# Patient Record
Sex: Female | Born: 1985 | Race: White | Hispanic: No | Marital: Married | Smoking: Former smoker
Health system: Southern US, Community
[De-identification: ages and names within clinical notes are randomized; demographics above are authoritative.]

## PROBLEM LIST (undated history)

## (undated) DIAGNOSIS — K219 Gastro-esophageal reflux disease without esophagitis: Secondary | ICD-10-CM

## (undated) DIAGNOSIS — F419 Anxiety disorder, unspecified: Secondary | ICD-10-CM

## (undated) HISTORY — PX: TONSILECTOMY/ADENOIDECTOMY WITH MYRINGOTOMY: SHX6125

## (undated) HISTORY — DX: Anxiety disorder, unspecified: F41.9

## (undated) HISTORY — PX: TONSILLECTOMY: SUR1361

---

## 2008-07-25 ENCOUNTER — Inpatient Hospital Stay (HOSPITAL_COMMUNITY): Admission: EM | Admit: 2008-07-25 | Discharge: 2008-07-26 | Payer: Self-pay | Admitting: Emergency Medicine

## 2008-11-09 ENCOUNTER — Other Ambulatory Visit: Admission: RE | Admit: 2008-11-09 | Discharge: 2008-11-09 | Payer: Self-pay | Admitting: Interventional Radiology

## 2008-11-09 ENCOUNTER — Encounter: Admission: RE | Admit: 2008-11-09 | Discharge: 2008-11-09 | Payer: Self-pay | Admitting: Surgery

## 2008-11-09 ENCOUNTER — Encounter (INDEPENDENT_AMBULATORY_CARE_PROVIDER_SITE_OTHER): Payer: Self-pay | Admitting: Interventional Radiology
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
## 2011-08-04 LAB — BASIC METABOLIC PANEL
CO2: 22
Chloride: 111
Creatinine, Ser: 0.64
GFR calc Af Amer: >60
Glucose, Bld: 122 — ABNORMAL HIGH
Sodium: 137

## 2011-08-04 LAB — CBC
Hemoglobin: 11.9 — ABNORMAL LOW
MCHC: 33.7
MCV: 91.4
RBC: 3.87
RDW: 12.8

## 2011-08-04 LAB — POCT I-STAT, CHEM 8
Calcium, Ion: 1.09 — ABNORMAL LOW
Creatinine, Ser: 0.9
Glucose, Bld: 104 — ABNORMAL HIGH
HCT: 37
Hemoglobin: 12.6
Potassium: 3.2 — ABNORMAL LOW

## 2011-08-04 LAB — URINALYSIS, ROUTINE W REFLEX MICROSCOPIC
Bilirubin Urine: NEGATIVE
Hgb urine dipstick: NEGATIVE
Ketones, ur: NEGATIVE
Nitrite: NEGATIVE
Specific Gravity, Urine: 1.017
Urobilinogen, UA: 0.2

## 2011-11-11 DIAGNOSIS — M25559 Pain in unspecified hip: Secondary | ICD-10-CM

## 2011-11-11 HISTORY — DX: Pain in unspecified hip: M25.559

## 2012-02-16 DIAGNOSIS — M792 Neuralgia and neuritis, unspecified: Secondary | ICD-10-CM | POA: Insufficient documentation

## 2012-03-02 DIAGNOSIS — G8929 Other chronic pain: Secondary | ICD-10-CM | POA: Insufficient documentation

## 2012-03-02 DIAGNOSIS — E041 Nontoxic single thyroid nodule: Secondary | ICD-10-CM | POA: Insufficient documentation

## 2012-11-12 DIAGNOSIS — B360 Pityriasis versicolor: Secondary | ICD-10-CM

## 2012-11-12 HISTORY — DX: Pityriasis versicolor: B36.0

## 2013-02-07 DIAGNOSIS — R0789 Other chest pain: Secondary | ICD-10-CM

## 2013-02-07 DIAGNOSIS — F411 Generalized anxiety disorder: Secondary | ICD-10-CM

## 2013-02-07 HISTORY — DX: Generalized anxiety disorder: F41.1

## 2013-02-07 HISTORY — DX: Other chest pain: R07.89

## 2013-05-18 DIAGNOSIS — N751 Abscess of Bartholin's gland: Secondary | ICD-10-CM

## 2013-05-18 HISTORY — DX: Abscess of Bartholin's gland: N75.1

## 2013-10-19 DIAGNOSIS — M79606 Pain in leg, unspecified: Secondary | ICD-10-CM

## 2013-10-19 HISTORY — DX: Pain in leg, unspecified: M79.606

## 2013-11-04 DIAGNOSIS — N12 Tubulo-interstitial nephritis, not specified as acute or chronic: Secondary | ICD-10-CM

## 2013-11-04 DIAGNOSIS — R300 Dysuria: Secondary | ICD-10-CM

## 2013-11-04 HISTORY — DX: Dysuria: R30.0

## 2013-11-04 HISTORY — DX: Tubulo-interstitial nephritis, not specified as acute or chronic: N12

## 2019-07-23 ENCOUNTER — Other Ambulatory Visit: Payer: Self-pay

## 2019-07-23 DIAGNOSIS — Z20822 Contact with and (suspected) exposure to covid-19: Secondary | ICD-10-CM

## 2019-07-24 LAB — NOVEL CORONAVIRUS, NAA: SARS-CoV-2, NAA: NOT DETECTED

## 2019-08-24 ENCOUNTER — Other Ambulatory Visit: Payer: Self-pay | Admitting: Cardiology

## 2019-08-24 DIAGNOSIS — Z20822 Contact with and (suspected) exposure to covid-19: Secondary | ICD-10-CM

## 2019-08-26 LAB — NOVEL CORONAVIRUS, NAA: SARS-CoV-2, NAA: NOT DETECTED

## 2019-09-10 ENCOUNTER — Other Ambulatory Visit: Payer: Self-pay

## 2019-09-10 DIAGNOSIS — Z20822 Contact with and (suspected) exposure to covid-19: Secondary | ICD-10-CM

## 2019-09-12 LAB — NOVEL CORONAVIRUS, NAA: SARS-CoV-2, NAA: NOT DETECTED

## 2019-09-26 ENCOUNTER — Other Ambulatory Visit: Payer: Self-pay

## 2019-09-26 DIAGNOSIS — Z20822 Contact with and (suspected) exposure to covid-19: Secondary | ICD-10-CM

## 2019-09-29 LAB — NOVEL CORONAVIRUS, NAA: SARS-CoV-2, NAA: NOT DETECTED

## 2019-10-03 ENCOUNTER — Other Ambulatory Visit: Payer: Self-pay

## 2019-10-20 ENCOUNTER — Other Ambulatory Visit: Payer: Self-pay | Admitting: Cardiology

## 2019-10-20 DIAGNOSIS — Z20822 Contact with and (suspected) exposure to covid-19: Secondary | ICD-10-CM

## 2019-10-21 LAB — NOVEL CORONAVIRUS, NAA: SARS-CoV-2, NAA: NOT DETECTED

## 2019-11-02 ENCOUNTER — Ambulatory Visit: Payer: BLUE CROSS/BLUE SHIELD | Attending: Internal Medicine

## 2019-11-02 DIAGNOSIS — Z20822 Contact with and (suspected) exposure to covid-19: Secondary | ICD-10-CM

## 2019-11-03 ENCOUNTER — Encounter: Payer: Self-pay | Admitting: *Deleted

## 2019-11-03 LAB — NOVEL CORONAVIRUS, NAA: SARS-CoV-2, NAA: DETECTED — AB

## 2019-11-29 DIAGNOSIS — Z803 Family history of malignant neoplasm of breast: Secondary | ICD-10-CM | POA: Insufficient documentation

## 2020-06-18 ENCOUNTER — Other Ambulatory Visit: Payer: BLUE CROSS/BLUE SHIELD

## 2020-08-07 ENCOUNTER — Other Ambulatory Visit: Payer: 59

## 2020-08-07 DIAGNOSIS — Z20822 Contact with and (suspected) exposure to covid-19: Secondary | ICD-10-CM

## 2020-08-09 LAB — NOVEL CORONAVIRUS, NAA: SARS-CoV-2, NAA: NOT DETECTED

## 2020-08-09 LAB — SARS-COV-2, NAA 2 DAY TAT

## 2021-02-14 ENCOUNTER — Encounter (HOSPITAL_BASED_OUTPATIENT_CLINIC_OR_DEPARTMENT_OTHER): Payer: Self-pay | Admitting: Nurse Practitioner

## 2021-02-14 ENCOUNTER — Ambulatory Visit (INDEPENDENT_AMBULATORY_CARE_PROVIDER_SITE_OTHER): Payer: 59 | Admitting: Nurse Practitioner

## 2021-02-14 ENCOUNTER — Other Ambulatory Visit: Payer: Self-pay

## 2021-02-14 VITALS — BP 124/79 | HR 65 | Ht 66.0 in | Wt 213.8 lb

## 2021-02-14 DIAGNOSIS — Z7689 Persons encountering health services in other specified circumstances: Secondary | ICD-10-CM | POA: Diagnosis not present

## 2021-02-14 DIAGNOSIS — M792 Neuralgia and neuritis, unspecified: Secondary | ICD-10-CM

## 2021-02-14 DIAGNOSIS — Z803 Family history of malignant neoplasm of breast: Secondary | ICD-10-CM

## 2021-02-14 DIAGNOSIS — K219 Gastro-esophageal reflux disease without esophagitis: Secondary | ICD-10-CM | POA: Insufficient documentation

## 2021-02-14 DIAGNOSIS — Z0000 Encounter for general adult medical examination without abnormal findings: Secondary | ICD-10-CM

## 2021-02-14 DIAGNOSIS — M5441 Lumbago with sciatica, right side: Secondary | ICD-10-CM

## 2021-02-14 DIAGNOSIS — N750 Cyst of Bartholin's gland: Secondary | ICD-10-CM

## 2021-02-14 DIAGNOSIS — R252 Cramp and spasm: Secondary | ICD-10-CM

## 2021-02-14 DIAGNOSIS — G8929 Other chronic pain: Secondary | ICD-10-CM

## 2021-02-14 DIAGNOSIS — Z9109 Other allergy status, other than to drugs and biological substances: Secondary | ICD-10-CM

## 2021-02-14 DIAGNOSIS — E041 Nontoxic single thyroid nodule: Secondary | ICD-10-CM

## 2021-02-14 DIAGNOSIS — N760 Acute vaginitis: Secondary | ICD-10-CM

## 2021-02-14 DIAGNOSIS — B9689 Other specified bacterial agents as the cause of diseases classified elsewhere: Secondary | ICD-10-CM

## 2021-02-14 DIAGNOSIS — M5442 Lumbago with sciatica, left side: Secondary | ICD-10-CM

## 2021-02-14 DIAGNOSIS — F410 Panic disorder [episodic paroxysmal anxiety] without agoraphobia: Secondary | ICD-10-CM

## 2021-02-14 DIAGNOSIS — R635 Abnormal weight gain: Secondary | ICD-10-CM | POA: Diagnosis not present

## 2021-02-14 DIAGNOSIS — A630 Anogenital (venereal) warts: Secondary | ICD-10-CM

## 2021-02-14 HISTORY — DX: Panic disorder (episodic paroxysmal anxiety): F41.0

## 2021-02-14 HISTORY — DX: Cyst of Bartholin's gland: N75.0

## 2021-02-14 HISTORY — DX: Acute vaginitis: N76.0

## 2021-02-14 HISTORY — DX: Other specified bacterial agents as the cause of diseases classified elsewhere: B96.89

## 2021-02-14 HISTORY — DX: Cramp and spasm: R25.2

## 2021-02-14 MED ORDER — PANTOPRAZOLE SODIUM 40 MG PO TBEC: 40.0000 mg | DELAYED_RELEASE_TABLET | Freq: Every day | ORAL | 1 refills | Status: DC

## 2021-02-14 NOTE — Assessment & Plan Note (Signed)
Physical exam with labs today. Overall healthy 35 year old female. Recommendations for diet and exercise discussed.  Chronic conditions reviewed and recommendations made.  Labs ordered.  Plan to follow-up in one year for CPE or sooner if needed.

## 2021-02-14 NOTE — Assessment & Plan Note (Signed)
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 Given recent weight gain and difficulty losing weight in the setting of underlying anxiety, will monitor TSH and thyroid levels today for re-evaluation.

## 2021-02-14 NOTE — Assessment & Plan Note (Signed)
New patient to establish with practice today.  Review of past and current medical history, medication history, surgical history, family history, and social determinants of health completed.  Recommendations provided.  Exam performed today with labs.

## 2021-02-14 NOTE — Assessment & Plan Note (Signed)
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  Given symptoms present and the limitations that this has on her overall life agree with referral to neurosurgery for further evaluation and recommendations on treatment options that may be of benefit to her.  Referral placed.

## 2021-02-14 NOTE — Assessment & Plan Note (Signed)
Previous rash to face after sun exposure of unknown etiology. No rash or irritation present today. No signs of butterfly rash present. No symptoms consistent with autoimmune etiology present today.  It is unclear at this time what could have caused the reaction to her face- only medications are OCP which she has been on since age 36 without previous issue.  No history of severe sunburn.  Will obtain labs today and recommend monitoring for return of symptoms.  Continue to wear sunscreen daily and protect exposed skin from UV light.  If symptoms return, recommend follow-up for evaluation.

## 2021-02-14 NOTE — Assessment & Plan Note (Signed)
20 lb weight gain over past year with difficulty loosing weight and keeping off for extended period of time worsened by pain limiting physical activity and sedentary job. Patient praised for finding exercise outlet with swimming and desire to loose weight with a healthy manner.  Recommend continued swimming until a resolution to her back pain can be determined. Gentle stretching exercises also encouraged. May want to consider incorporating walking as tolerated on flat surface with supportive shoes.  Recommend monitoring caloric intake and a balanced diet with portion control. Avoid "fad" dieting plans and make lifestyle changes that are long term rather than just for weight loss.  Recommend decrease alcohol intake to avoid excessive caloric intake from non-nutrient sources.  Strong family history of obesity and diabetes in mother, therefore, will test today to ensure that no signs of pre-diabetes are present at this time.  Discussed options of medications that may be helpful to increase metabolism and aid with weight loss if diet and lifestyle choices are not effective.  Information provided.

## 2021-02-14 NOTE — Assessment & Plan Note (Signed)
Symptoms and presentation consistent with GERD with exacerbation with increased stress. No warning signs present or indications of bleed.  Will obtain labs today for further evaluation.  Reassured patient that daily famotidine use is acceptable, but discussed option of 60 day trial with pantoprazole in the event a small ulceration is present to help with healing given the increased incidence of symptoms. She would like to try the pantoprazole at this time.  Recommend follow-up if symptoms return or fail to improve after therapy.
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
## 2021-02-14 NOTE — Assessment & Plan Note (Signed)
History of breast cancer in mother at around 33 years of age.  No concerns present today.  Will plan to begin routine monitoring for breast cancer with mammogram starting at age 76 or sooner if symptoms or concerns present.

## 2021-02-14 NOTE — Patient Instructions (Addendum)
Recommendations from today's visit:          _________________________________________________________________________________ Thank you for choosing [place] at [facility] for your Primary Care needs. I am excited for the opportunity to partner with you to meet your health care goals. It was a pleasure meeting you today!  I am an Adult-Geriatric Nurse Practitioner with a background in caring for patients for more than 20 years. I received my Paediatric nurse in Nursing and my Doctor of Nursing Practice degrees at [employer]. I received additional fellowship training in primary care and sports medicine after receiving my doctorate degree. I provide primary care and sports medicine services to patients age 33 and older within this office. I am also a provider with the [facility] and the director of the APP Fellowship with [facility].  I am a [place] native, but have called the [place] area home for nearly 20 years and am proud to be a member of this community.   I am passionate about providing the best service to you through preventive medicine and supportive care. I consider you a part of the medical team and value your input. I work diligently to ensure that you are heard and your needs are met in a safe and effective manner. I want you to feel comfortable with me as your provider and want you to know that your health concerns are important to me.   For your information, our office hours are Monday- Friday 8:00 AM - 5:00 PM At this time I am not in the office on Wednesdays.  If you have questions or concerns, please call our office at [phone] or send [place] a MyChart message and we will respond as quickly as possible.   For all urgent or time sensitive needs we ask that you please call the office to avoid delays. MyChart is not constantly monitored and replies may take up to 72 business hours.  MyChart  Policy: . MyChart allows for you to see your visit notes, after visit summary, provider recommendations, lab and tests results, make an appointment, request refills, and contact your provider or the office for non-urgent questions or concerns.  . Providers are seeing patients during normal business hours and do not have built in time to review MyChart messages. We ask that you allow a minimum of 72 business hours for MyChart message responses.  . Complex MyChart concerns may require a visit. Your provider may request you schedule a virtual or in person visit to ensure we are providing the best care possible. . MyChart messages sent after 4:00 PM on Friday will not be received by the provider until Monday morning.    Lab and Test Results: . You will receive your lab and test results on MyChart as soon as they are completed and results have been sent by the lab or testing facility. Due to this service, you will receive your results BEFORE your provider.  . Please allow a minimum of 72 business hours for your provider to receive and review lab and test results and contact you about.   . Most lab and test result comments from the provider will be sent through [place]. Your provider may recommend changes to the plan of care, follow-up visits, repeat testing, ask questions, or request an office visit to discuss these results. You may reply directly to this message or call the office at [phone] to provide information for the provider or set up an appointment. Marland Kitchen  In some instances, you will be called with test results and recommendations. Please let us know if this is preferred and we will make note of this in your chart to provide this for you.    . If you have not heard a response to your lab or test results in 72 business hours, please call the office to let us know.   After Hours: . For all non-emergency after hours needs, please call the office at [phone] and select the option to reach the  on-call provider service. On-call services are shared between multiple [place] offices and therefore it will not be possible to speak directly with your provider. On-call providers may provide medical advice and recommendations, but are unable to provide refills for maintenance medications.  . For all emergency or urgent medical needs after normal business hours, we recommend that you seek care at the closest Urgent Care or Emergency Department to ensure appropriate treatment in a timely manner.  [facility] [place] [facility] has a 24 hour emergency room located on the ground floor for your convenience.    Please do not hesitate to reach out to [place] with concerns.   Thank you, again, for choosing me as your health care partner. I appreciate your trust and look forward to learning more about you.   SaraBeth Jiyan Walkowski, DNP, AGNP-c ___________________________________________________________________________________  Calorie Counting for Weight Loss Calories are units of energy. Your body needs a certain number of calories from food to keep going throughout the day. When you eat or drink more calories than your body needs, your body stores the extra calories mostly as fat. When you eat or drink fewer calories than your body needs, your body burns fat to get the energy it needs. Calorie counting means keeping track of how many calories you eat and drink each day. Calorie counting can be helpful if you need to lose weight. If you eat fewer calories than your body needs, you should lose weight. Ask your health care provider what a healthy weight is for you. For calorie counting to work, you will need to eat the right number of calories each day to lose a healthy amount of weight per week. A dietitian can help you figure out how many calories you need in a day and will suggest ways to reach your calorie goal.  A healthy amount of weight to lose each week is usually 1-2 lb (0.5-0.9 kg). This usually  means that your daily calorie intake should be reduced by 500-750 calories.  Eating 1,200-1,500 calories a day can help most women lose weight.  Eating 1,500-1,800 calories a day can help most men lose weight. What do I need to know about calorie counting? Work with your health care provider or dietitian to determine how many calories you should get each day. To meet your daily calorie goal, you will need to:  Find out how many calories are in each food that you would like to eat. Try to do this before you eat.  Decide how much of the food you plan to eat.  Keep a food log. Do this by writing down what you ate and how many calories it had. To successfully lose weight, it is important to balance calorie counting with a healthy lifestyle that includes regular activity. Where do I find calorie information? The number of calories in a food can be found on a Nutrition Facts label. If a food does not have a Nutrition Facts label, try to look up  the calories online or ask your dietitian for help. Remember that calories are listed per serving. If you choose to have more than one serving of a food, you will have to multiply the calories per serving by the number of servings you plan to eat. For example, the label on a package of bread might say that a serving size is 1 slice and that there are 90 calories in a serving. If you eat 1 slice, you will have eaten 90 calories. If you eat 2 slices, you will have eaten 180 calories.   How do I keep a food log? After each time that you eat, record the following in your food log as soon as possible:  What you ate. Be sure to include toppings, sauces, and other extras on the food.  How much you ate. This can be measured in cups, ounces, or number of items.  How many calories were in each food and drink.  The total number of calories in the food you ate. Keep your food log near you, such as in a pocket-sized notebook or on an app or website on your mobile  phone. Some programs will calculate calories for you and show you how many calories you have left to meet your daily goal. What are some portion-control tips?  Know how many calories are in a serving. This will help you know how many servings you can have of a certain food.  Use a measuring cup to measure serving sizes. You could also try weighing out portions on a kitchen scale. With time, you will be able to estimate serving sizes for some foods.  Take time to put servings of different foods on your favorite plates or in your favorite bowls and cups so you know what a serving looks like.  Try not to eat straight from a food's packaging, such as from a bag or box. Eating straight from the package makes it hard to see how much you are eating and can lead to overeating. Put the amount you would like to eat in a cup or on a plate to make sure you are eating the right portion.  Use smaller plates, glasses, and bowls for smaller portions and to prevent overeating.  Try not to multitask. For example, avoid watching TV or using your computer while eating. If it is time to eat, sit down at a table and enjoy your food. This will help you recognize when you are full. It will also help you be more mindful of what and how much you are eating. What are tips for following this plan? Reading food labels  Check the calorie count compared with the serving size. The serving size may be smaller than what you are used to eating.  Check the source of the calories. Try to choose foods that are high in protein, fiber, and vitamins, and low in saturated fat, trans fat, and sodium. Shopping  Read nutrition labels while you shop. This will help you make healthy decisions about which foods to buy.  Pay attention to nutrition labels for low-fat or fat-free foods. These foods sometimes have the same number of calories or more calories than the full-fat versions. They also often have added sugar, starch, or salt to make  up for flavor that was removed with the fat.  Make a grocery list of lower-calorie foods and stick to it. Cooking  Try to cook your favorite foods in a healthier way. For example, try baking instead of frying.  Use low-fat dairy products. Meal planning  Use more fruits and vegetables. One-half of your plate should be fruits and vegetables.  Include lean proteins, such as chicken, [place], and fish. Lifestyle Each week, aim to do one of the following:  150 minutes of moderate exercise, such as walking.  75 minutes of vigorous exercise, such as running. General information  Know how many calories are in the foods you eat most often. This will help you calculate calorie counts faster.  Find a way of tracking calories that works for you. Get creative. Try different apps or programs if writing down calories does not work for you. What foods should I eat?  Eat nutritious foods. It is better to have a nutritious, high-calorie food, such as an avocado, than a food with few nutrients, such as a bag of potato chips.  Use your calories on foods and drinks that will fill you up and will not leave you hungry soon after eating. ? Examples of foods that fill you up are nuts and nut butters, vegetables, lean proteins, and high-fiber foods such as whole grains. High-fiber foods are foods with more than 5 g of fiber per serving.  Pay attention to calories in drinks. Low-calorie drinks include water and unsweetened drinks. The items listed above may not be a complete list of foods and beverages you can eat. Contact a dietitian for more information.   What foods should I limit? Limit foods or drinks that are not good sources of vitamins, minerals, or protein or that are high in unhealthy fats. These include:  Candy.  Other sweets.  Sodas, specialty coffee drinks, alcohol, and juice. The items listed above may not be a complete list of foods and beverages you should avoid. Contact a dietitian for  more information. How do I count calories when eating out?  Pay attention to portions. Often, portions are much larger when eating out. Try these tips to keep portions smaller: ? Consider sharing a meal instead of getting your own. ? If you get your own meal, eat only half of it. Before you start eating, ask for a container and put half of your meal into it. ? When available, consider ordering smaller portions from the menu instead of full portions.  Pay attention to your food and drink choices. Knowing the way food is cooked and what is included with the meal can help you eat fewer calories. ? If calories are listed on the menu, choose the lower-calorie options. ? Choose dishes that include vegetables, fruits, whole grains, low-fat dairy products, and lean proteins. ? Choose items that are boiled, broiled, grilled, or steamed. Avoid items that are buttered, battered, fried, or served with cream sauce. Items labeled as crispy are usually fried, unless stated otherwise. ? Choose water, low-fat milk, unsweetened iced tea, or other drinks without added sugar. If you want an alcoholic beverage, choose a lower-calorie option, such as a glass of wine or light beer. ? Ask for dressings, sauces, and syrups on the side. These are usually high in calories, so you should limit the amount you eat. ? If you want a salad, choose a garden salad and ask for grilled meats. Avoid extra toppings such as bacon, cheese, or fried items. Ask for the dressing on the side, or ask for olive oil and vinegar or lemon to use as dressing.  Estimate how many servings of a food you are given. Knowing serving sizes will help you be aware of how much food you are  eating at restaurants. Where to find more information  Centers for Disease Control and Prevention: http://www.wolf.info/  U.[place]. Department of Agriculture: http://www.wilson-mendoza.org/ Summary  Calorie counting means keeping track of how many calories you eat and drink each day. If you eat fewer  calories than your body needs, you should lose weight.  A healthy amount of weight to lose per week is usually 1-2 lb (0.5-0.9 kg). This usually means reducing your daily calorie intake by 500-750 calories.  The number of calories in a food can be found on a Nutrition Facts label. If a food does not have a Nutrition Facts label, try to look up the calories online or ask your dietitian for help.  Use smaller plates, glasses, and bowls for smaller portions and to prevent overeating.  Use your calories on foods and drinks that will fill you up and not leave you hungry shortly after a meal. This information is not intended to replace advice given to you by your health care provider. Make sure you discuss any questions you have with your health care provider. Document Revised: 12/01/2019 Document Reviewed: 12/01/2019 Elsevier Patient Education  2021 [place].    Gastroesophageal Reflux Disease, Adult  Gastroesophageal reflux (GER) happens when acid from the stomach flows up into the tube that connects the mouth and the stomach (esophagus). Normally, food travels down the esophagus and stays in the stomach to be digested. With GER, food and stomach acid sometimes move back up into the esophagus. You may have a disease called gastroesophageal reflux disease (GERD) if the reflux:  Happens often.  Causes frequent or very bad symptoms.  Causes problems such as damage to the esophagus. When this happens, the esophagus becomes sore and swollen. Over time, GERD can make small holes (ulcers) in the lining of the esophagus. What are the causes? This condition is caused by a problem with the muscle between the esophagus and the stomach. When this muscle is weak or not normal, it does not close properly to keep food and acid from coming back up from the stomach. The muscle can be weak because of:  Tobacco use.  Pregnancy.  Having a certain type of hernia (hiatal hernia).  Alcohol  use.  Certain foods and drinks, such as coffee, chocolate, onions, and peppermint. What increases the risk?  Being overweight.  Having a disease that affects your connective tissue.  Taking NSAIDs, such a ibuprofen. What are the signs or symptoms?  Heartburn.  Difficult or painful swallowing.  The feeling of having a lump in the throat.  A bitter taste in the mouth.  Bad breath.  Having a lot of saliva.  Having an upset or bloated stomach.  Burping.  Chest pain. Different conditions can cause chest pain. Make sure you see your doctor if you have chest pain.  Shortness of breath or wheezing.  A long-term cough or a cough at night.  Wearing away of the surface of teeth (tooth enamel).  Weight loss. How is this treated?  Making changes to your diet.  Taking medicine.  Having surgery. Treatment will depend on how bad your symptoms are. Follow these instructions at home: Eating and drinking  Follow a diet as told by your doctor. You may need to avoid foods and drinks such as: ? Coffee and tea, with or without caffeine. ? Drinks that contain alcohol. ? Energy drinks and sports drinks. ? Bubbly (carbonated) drinks or sodas. ? Chocolate and cocoa. ? Peppermint and mint flavorings. ? Garlic and onions. ?  Horseradish. ? Spicy and acidic foods. These include peppers, chili powder, curry powder, vinegar, hot sauces, and BBQ sauce. ? Citrus fruit juices and citrus fruits, such as oranges, lemons, and limes. ? Tomato-based foods. These include red sauce, chili, salsa, and pizza with red sauce. ? Fried and fatty foods. These include donuts, french fries, potato chips, and high-fat dressings. ? High-fat meats. These include hot dogs, rib eye steak, sausage, ham, and bacon. ? High-fat dairy items, such as whole milk, butter, and cream cheese.  Eat small meals often. Avoid eating large meals.  Avoid drinking large amounts of liquid with your meals.  Avoid eating  meals during the 2-3 hours before bedtime.  Avoid lying down right after you eat.  Do not exercise right after you eat.   Lifestyle  Do not smoke or use any products that contain nicotine or tobacco. If you need help quitting, ask your doctor.  Try to lower your stress. If you need help doing this, ask your doctor.  If you are overweight, lose an amount of weight that is healthy for you. Ask your doctor about a safe weight loss goal.   General instructions  Pay attention to any changes in your symptoms.  Take over-the-counter and prescription medicines only as told by your doctor.  Do not take aspirin, ibuprofen, or other NSAIDs unless your doctor says it is okay.  Wear loose clothes. Do not wear anything tight around your waist.  Raise (elevate) the head of your bed about 6 inches (15 cm). You may need to use a wedge to do this.  Avoid bending over if this makes your symptoms worse.  Keep all follow-up visits. Contact a doctor if:  You have new symptoms.  You lose weight and you do not know why.  You have trouble swallowing or it hurts to swallow.  You have wheezing or a cough that keeps happening.  You have a hoarse voice.  Your symptoms do not get better with treatment. Get help right away if:  You have sudden pain in your arms, neck, jaw, teeth, or back.  You suddenly feel sweaty, dizzy, or light-headed.  You have chest pain or shortness of breath.  You vomit and the vomit is green, yellow, or black, or it looks like blood or coffee grounds.  You faint.  Your poop (stool) is red, bloody, or black.  You cannot swallow, drink, or eat. These symptoms may represent a serious problem that is an emergency. Do not wait to see if the symptoms will go away. Get medical help right away. Call your local emergency services (911 in the [place] Do not drive yourself to the hospital. Summary  If a person has gastroesophageal reflux disease (GERD), food and stomach acid  move back up into the esophagus and cause symptoms or problems such as damage to the esophagus.  Treatment will depend on how bad your symptoms are.  Follow a diet as told by your doctor.  Take all medicines only as told by your doctor. This information is not intended to replace advice given to you by your health care provider. Make sure you discuss any questions you have with your health care provider. Document Revised: 04/30/2020 Document Reviewed: 04/30/2020 Elsevier Patient Education  2021 [place].   Herniated Disk  A herniated disk, also called a ruptured disk or slipped disk, occurs when a disk in the spine bulges out too far. Between the bones in the spine (vertebrae), there are oval disks that are  made of a soft, spongy center filled with liquid that is surrounded by a tough outer ring. The disks connect the vertebrae, help the spine move, and keep the bones from rubbing against each other when you move. When you have a herniated disk, the spongy center of the disk bulges out or breaks through the outer ring. The spongy center can press on a nerve between the vertebrae and cause pain. This can occur anywhere in the back or neck area, but the lower back is most commonly affected. What are the causes? This condition may be caused by:  Age-related wear and tear.  Sudden injury, such as a strain or sprain. What increases the risk? The following factors may make you more likely to develop this condition:  Aging. This is the main risk factor for a herniated disk.  Being a man who is 38-88 years old.  Frequently doing activities that involve heavy lifting, bending, or twisting.  Not getting enough exercise.  Being overweight.  Using tobacco products. What are the signs or symptoms? Symptoms may vary depending on where your herniated disk is located.  A herniated disk in the lower back may cause: ? Sharp pain in part of the hips, buttocks, or legs. ? Sharp pain in the  lower back, spreading down through the leg into the foot (sciatica).  A herniated disk in the neck may cause dizziness and vertigo. It may also cause pain or weakness in the neck, shoulder, or upper arm, forearm, or fingers. You may also have muscle weakness. You may have trouble:  Lifting your leg or arm.  Standing on your toes.  Squeezing tightly with one of your hands. Other symptoms may include:  Numbness or tingling in the affected areas of the hands, arms, feet, or legs.  Inability to control when you urinate or when you have bowel movements. This is a rare but serious sign of a severe herniated disk in the lower back. How is this diagnosed? This condition may be diagnosed based on:  Your symptoms.  Your medical history.  A physical exam. The exam may include: ? A straight-leg test. For this test, you will lie on your back while your health care provider lifts your leg, keeping your knee straight. If you feel pain, you likely have a herniated disk. ? Neurologic tests. These include checking for numbness, reflexes, muscle strength, and problems with posture.  Imaging tests, such as: ? X-rays. ? MRI. ? CT scan. ? Electromyogram (EMG) to check the nerves that control muscles. How is this treated? Treatment for this condition may include:  A period of light, painless activity for a few days to several weeks. Complete bed rest is not recommended. ? If you have a herniated disk in your lower back, avoid sitting as it increases pressure on the disk.  Medicines. These may include: ? NSAIDs, such as ibuprofen, to help reduce pain and swelling. ? Muscle relaxants to prevent sudden tightening of the back muscles (back spasms). ? Prescription pain medicines, if you have severe pain.  Ice or heat therapy.  Steroid injections in the area of the herniated disk. These can help reduce pain and swelling.  Physical therapy to strengthen your back muscles. In many cases, symptoms go  away with treatment over a period of days or weeks. You will most likely be free of symptoms after 3-4 months. If other treatments do not help to relieve your symptoms, you may need surgery. Follow these instructions at home: Medicines  Take  over-the-counter and prescription medicines only as told by your health care provider.  Ask your health care provider if the medicine prescribed to you: ? Requires you to avoid driving or using heavy machinery. ? Can cause constipation. You may need to take these actions to prevent or treat constipation:  Drink enough fluid to keep your urine pale yellow.  Take over-the-counter or prescription medicines.  Eat foods that are high in fiber, such as beans, whole grains, and fresh fruits and vegetables.  Limit foods that are high in fat and processed sugars, such as fried or sweet foods. Managing pain, stiffness, and swelling  If directed, put ice on the painful area. Icing can help to relieve pain. To do this: ? Put ice in a plastic bag. ? Place a towel between your skin and the bag. ? Leave the ice on for 20 minutes, 2-3 times a day. ? Remove the ice if your skin turns bright red. This is very important. If you cannot feel pain, heat, or cold, you have a greater risk of damage to the area.  If directed, apply heat to the painful area as often as told by your health care provider. Heat can reduce the stiffness of your muscles. Use the heat source that your health care provider recommends, such as a moist heat pack or a heating pad. ? Place a towel between your skin and the heat source. ? Leave the heat on for 20-30 minutes. ? Remove the heat if your skin turns bright red. This is especially important if you are unable to feel pain, heat, or cold. You may have a greater risk of getting burned.      Activity  Avoid strict bed rest but only do activities that are painless.  Gradually return to your normal activities and exercise as told by your  health care provider. Ask your health care provider what activities and exercises are safe for you.  Use good posture.  Avoid movements that cause pain.  Do not lift anything that is heavier than 10 lb (4.5 kg), or the limit that you are told, until your health care provider says that it is safe.  Do not sit or stand for long periods of time without changing positions.  Do not sit for long periods of time without getting up and moving around.  If physical therapy was prescribed, do exercises as told by your health care provider.  Aim to strengthen muscles in your back and abdomen with exercises such as swimming or walking. General instructions  Do not use any products that contain nicotine or tobacco, such as cigarettes, e-cigarettes, and chewing tobacco. These products can delay healing. If you need help quitting, ask your health care provider.  Do not wear high-heeled shoes.  Do not sleep on your abdomen.  If you are overweight, work with your health care provider to lose weight safely.  Keep all follow-up visits. This is important. How is this prevented?  Maintain a healthy weight.  Maintain physical fitness. Do at least 150 minutes of moderate-intensity exercise each week, such as brisk walking or water aerobics.  When lifting objects: ? Keep your feet at least shoulder-width apart and tighten the muscles of your abdomen. ? Keep your spine neutral as you bend your knees and hips. It is important to lift using the strength of your legs, not your back. Do not lock your knees straight out. ? Always ask for help to lift heavy or awkward objects. Contact a health care  provider if you:  Have back pain or neck pain that does not get better after 6 weeks.  Have severe pain in your back, neck, legs, or arms.  Develop numbness, tingling, or weakness anywhere in your body. Get help right away if:  You cannot move your arms or legs.  You cannot control when you urinate or have  bowel movements.  You feel dizzy or you faint.  You have shortness of breath. These symptoms may represent a serious problem that is an emergency. Do not wait to see if the symptoms will go away. Get medical help right away. Call your local emergency services (911 in the [place] Do not drive yourself to the hospital. Summary  A herniated disk, also called a ruptured disk or slipped disk, occurs when a disk in the spine bulges out too far (herniates).  This condition may be caused by age-related wear and tear or a sudden injury.  Symptoms may vary depending on where your herniated disk is located.  Treatment may include rest, medicines, ice or heat therapy, steroid injections, and physical therapy.  If other treatments do not help to relieve your symptoms, you may need surgery. This information is not intended to replace advice given to you by your health care provider. Make sure you discuss any questions you have with your health care provider. Document Revised: 02/08/2020 Document Reviewed: 02/08/2020 Elsevier Patient Education  2021 [employer].

## 2021-02-14 NOTE — Assessment & Plan Note (Signed)
History of HPV with one outbreak several years ago and no additional symptoms at this time.  Recent pap in 2021 negative for cervical HPV. No new sexual partners. No symptoms in partner at this time.  Discussed monitoring for symptoms and if outbreak occurs to let me know and we can provide medication.  She is agreeable to plan.

## 2021-02-15 ENCOUNTER — Other Ambulatory Visit (HOSPITAL_BASED_OUTPATIENT_CLINIC_OR_DEPARTMENT_OTHER)
Admission: RE | Admit: 2021-02-15 | Discharge: 2021-02-15 | Disposition: A | Discharge status: Home / Self Care | Payer: 59 | Source: Ambulatory Visit | Attending: Nurse Practitioner | Admitting: Nurse Practitioner

## 2021-02-15 DIAGNOSIS — Z0000 Encounter for general adult medical examination without abnormal findings: Secondary | ICD-10-CM | POA: Insufficient documentation

## 2021-02-15 DIAGNOSIS — R635 Abnormal weight gain: Secondary | ICD-10-CM | POA: Diagnosis present

## 2021-02-15 LAB — CBC WITH DIFFERENTIAL/PLATELET
Abs Immature Granulocytes: 0 10*3/uL (ref 0.00–0.07)
Basophils Absolute: 0 10*3/uL (ref 0.0–0.1)
Basophils Relative: 1 %
Eosinophils Absolute: 0.1 10*3/uL (ref 0.0–0.5)
Eosinophils Relative: 3 %
HCT: 41.8 % (ref 36.0–46.0)
Hemoglobin: 14.1 g/dL (ref 12.0–15.0)
Immature Granulocytes: 0 %
Lymphocytes Relative: 43 %
Lymphs Abs: 2 10*3/uL (ref 0.7–4.0)
MCH: 31.3 pg (ref 26.0–34.0)
MCHC: 33.7 g/dL (ref 30.0–36.0)
MCV: 92.7 fL (ref 80.0–100.0)
Monocytes Absolute: 0.3 10*3/uL (ref 0.1–1.0)
Monocytes Relative: 7 %
Neutro Abs: 2.2 10*3/uL (ref 1.7–7.7)
Neutrophils Relative %: 46 %
Platelets: 207 10*3/uL (ref 150–400)
RBC: 4.51 MIL/uL (ref 3.87–5.11)
RDW: 12.2 % (ref 11.5–15.5)
WBC: 4.7 10*3/uL (ref 4.0–10.5)
nRBC: 0 % (ref 0.0–0.2)

## 2021-02-15 LAB — COMPREHENSIVE METABOLIC PANEL
ALT: 22 U/L (ref 0–44)
AST: 20 U/L (ref 15–41)
Albumin: 4.3 g/dL (ref 3.5–5.0)
Alkaline Phosphatase: 52 U/L (ref 38–126)
Anion gap: 8 (ref 5–15)
BUN: 10 mg/dL (ref 6–20)
CO2: 25 mmol/L (ref 22–32)
Calcium: 9.2 mg/dL (ref 8.9–10.3)
Chloride: 103 mmol/L (ref 98–111)
Creatinine, Ser: 0.67 mg/dL (ref 0.44–1.00)
GFR, Estimated: >60 mL/min (ref >60)
Glucose, Bld: 85 mg/dL (ref 70–99)
Potassium: 4.2 mmol/L (ref 3.5–5.1)
Sodium: 136 mmol/L (ref 135–145)
Total Bilirubin: 0.6 mg/dL (ref 0.3–1.2)
Total Protein: 7.5 g/dL (ref 6.5–8.1)

## 2021-02-15 LAB — HEMOGLOBIN A1C
Hgb A1c MFr Bld: 4.9 % (ref 4.8–5.6)
Mean Plasma Glucose: 93.93 mg/dL

## 2021-02-15 LAB — LIPID PANEL
Cholesterol: 203 mg/dL — ABNORMAL HIGH (ref 0–200)
HDL: 78 mg/dL (ref >40)
LDL Cholesterol: 104 mg/dL — ABNORMAL HIGH (ref 0–99)
Total CHOL/HDL Ratio: 2.6 RATIO
Triglycerides: 103 mg/dL (ref <150)
VLDL: 21 mg/dL (ref 0–40)

## 2021-02-18 ENCOUNTER — Encounter (HOSPITAL_BASED_OUTPATIENT_CLINIC_OR_DEPARTMENT_OTHER): Payer: Self-pay | Admitting: Nurse Practitioner

## 2021-02-18 DIAGNOSIS — K219 Gastro-esophageal reflux disease without esophagitis: Secondary | ICD-10-CM

## 2021-02-18 MED ORDER — PANTOPRAZOLE SODIUM 40 MG PO TBEC: 40.0000 mg | DELAYED_RELEASE_TABLET | Freq: Every day | ORAL | 3 refills | Status: DC

## 2021-02-18 NOTE — Progress Notes (Signed)
Overall labs look good.  Thyroid levels, hemoglobin A1c, metabolic panel, and general blood counts are all within the normal range.  Cholesterol very slightly elevated. Recommend increase physical activity to at least per day 5 days a week and reducing saturated fats and carbohydrates from diet.

## 2021-02-21 ENCOUNTER — Telehealth (HOSPITAL_BASED_OUTPATIENT_CLINIC_OR_DEPARTMENT_OTHER): Payer: Self-pay

## 2021-02-21 NOTE — Telephone Encounter (Signed)
-----   Message from Tollie Eth, NP sent at 02/18/2021  7:57 AM EDT ----- Overall labs look good.  Thyroid levels, hemoglobin A1c, metabolic panel, and general blood counts are all within the normal range.  Cholesterol very slightly elevated. Recommend increase physical activity to at least per day 5 days a week and reducing saturated fats and carbohydrates from diet.

## 2021-02-21 NOTE — Telephone Encounter (Signed)
Results released by Shawna Clamp, AGNP and reviewed by patient.

## 2021-02-22 LAB — THYROID PANEL WITH TSH
Free Thyroxine Index: 1.9 (ref 1.2–4.9)
T3 Uptake Ratio: 25 % (ref 24–39)
T4, Total: 7.5 ug/dL (ref 4.5–12.0)
TSH: 1.55 u[IU]/mL (ref 0.450–4.500)

## 2021-03-03 ENCOUNTER — Encounter (HOSPITAL_BASED_OUTPATIENT_CLINIC_OR_DEPARTMENT_OTHER): Payer: Self-pay | Admitting: Nurse Practitioner

## 2021-03-04 ENCOUNTER — Other Ambulatory Visit (HOSPITAL_BASED_OUTPATIENT_CLINIC_OR_DEPARTMENT_OTHER): Payer: Self-pay | Admitting: Nurse Practitioner

## 2021-03-29 ENCOUNTER — Other Ambulatory Visit (HOSPITAL_BASED_OUTPATIENT_CLINIC_OR_DEPARTMENT_OTHER): Payer: Self-pay | Admitting: Nurse Practitioner

## 2021-03-29 DIAGNOSIS — M5441 Lumbago with sciatica, right side: Secondary | ICD-10-CM

## 2021-03-29 DIAGNOSIS — G8929 Other chronic pain: Secondary | ICD-10-CM

## 2021-04-10 ENCOUNTER — Other Ambulatory Visit (HOSPITAL_BASED_OUTPATIENT_CLINIC_OR_DEPARTMENT_OTHER): Payer: Self-pay | Admitting: Nurse Practitioner

## 2021-04-10 DIAGNOSIS — M5417 Radiculopathy, lumbosacral region: Secondary | ICD-10-CM

## 2021-04-10 DIAGNOSIS — M5412 Radiculopathy, cervical region: Secondary | ICD-10-CM

## 2021-04-10 DIAGNOSIS — M5442 Lumbago with sciatica, left side: Secondary | ICD-10-CM
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
## 2021-04-16 ENCOUNTER — Telehealth (HOSPITAL_BASED_OUTPATIENT_CLINIC_OR_DEPARTMENT_OTHER): Payer: Self-pay | Admitting: Nurse Practitioner

## 2021-04-16 DIAGNOSIS — M792 Neuralgia and neuritis, unspecified: Secondary | ICD-10-CM

## 2021-04-16 DIAGNOSIS — M5442 Lumbago with sciatica, left side: Secondary | ICD-10-CM

## 2021-04-16 DIAGNOSIS — G8929 Other chronic pain: Secondary | ICD-10-CM

## 2021-04-16 DIAGNOSIS — M5417 Radiculopathy, lumbosacral region: Secondary | ICD-10-CM

## 2021-04-16 DIAGNOSIS — M5412 Radiculopathy, cervical region: Secondary | ICD-10-CM

## 2021-04-16 NOTE — Addendum Note (Signed)
Addended by: Anna-Marie Coller, Huntley Dec E on: 04/16/2021 05:59 PM   Modules accepted: Orders

## 2021-04-16 NOTE — Telephone Encounter (Signed)
Please call Denisa to let her know that the recent MRI was not approved by insurance for cervical and lumbar spine. Often times x-ray results are required initially.  Orders have been placed for cervical and lumbar x-ray for initial evaluation of radicular and sciatic pain.  The new referral has been placed for neurosurgery with different provider.

## 2021-04-16 NOTE — Telephone Encounter (Signed)
Called pt back regarding the MRI PA. Let pt know it did get denied and that DNP Huntley Dec Early will send in for an X-ray and if it need to be looked at further then insurance might approve for a MRI then. Let her know that when that is put in the nurse should be giving her a call about it. I will close these two MRI referrals. Lawler Please advise.

## 2021-04-19 ENCOUNTER — Other Ambulatory Visit (HOSPITAL_BASED_OUTPATIENT_CLINIC_OR_DEPARTMENT_OTHER): Payer: Self-pay | Admitting: Nurse Practitioner

## 2021-04-19 DIAGNOSIS — K219 Gastro-esophageal reflux disease without esophagitis: Secondary | ICD-10-CM

## 2021-04-22 ENCOUNTER — Other Ambulatory Visit: Payer: Self-pay

## 2021-04-22 ENCOUNTER — Ambulatory Visit (HOSPITAL_BASED_OUTPATIENT_CLINIC_OR_DEPARTMENT_OTHER)
Admission: RE | Admit: 2021-04-22 | Discharge: 2021-04-22 | Disposition: A | Discharge status: Home / Self Care | Payer: 59 | Source: Ambulatory Visit | Attending: Nurse Practitioner | Admitting: Nurse Practitioner

## 2021-04-22 DIAGNOSIS — M5442 Lumbago with sciatica, left side: Secondary | ICD-10-CM | POA: Diagnosis not present

## 2021-04-22 DIAGNOSIS — M5417 Radiculopathy, lumbosacral region: Secondary | ICD-10-CM | POA: Diagnosis present

## 2021-04-22 DIAGNOSIS — M5441 Lumbago with sciatica, right side: Secondary | ICD-10-CM | POA: Insufficient documentation

## 2021-04-22 DIAGNOSIS — M792 Neuralgia and neuritis, unspecified: Secondary | ICD-10-CM | POA: Diagnosis present

## 2021-04-22 DIAGNOSIS — M5412 Radiculopathy, cervical region: Secondary | ICD-10-CM | POA: Insufficient documentation

## 2021-04-22 DIAGNOSIS — G8929 Other chronic pain: Secondary | ICD-10-CM

## 2021-04-26 NOTE — Progress Notes (Signed)
Cervical spine shows loss of lordosis with no other abnormalities noted.   Recommend therapeutic exercises and posture training to help regain natural curvature

## 2021-04-26 NOTE — Progress Notes (Signed)
Disc space narrowing noted at L5-S1 which is consistent with sciatic nerve pain reported.  Recommend f/u with spine specialist for recommendations.

## 2021-04-29 ENCOUNTER — Telehealth (HOSPITAL_BASED_OUTPATIENT_CLINIC_OR_DEPARTMENT_OTHER): Payer: Self-pay

## 2021-04-29 NOTE — Telephone Encounter (Signed)
Results released by Sarabeth Early, AGNP and reviewed by patient via MyChart. Instructed patient to contact the office with any questions or concerns.  

## 2021-04-29 NOTE — Telephone Encounter (Signed)
-----   Message from Tollie Eth, NP sent at 04/26/2021  8:59 AM EDT ----- Disc space narrowing noted at L5-S1 which is consistent with sciatic nerve pain reported.  Recommend f/u with spine specialist for recommendations.

## 2021-05-09 ENCOUNTER — Telehealth (HOSPITAL_BASED_OUTPATIENT_CLINIC_OR_DEPARTMENT_OTHER): Payer: Self-pay | Admitting: Nurse Practitioner

## 2021-05-09 NOTE — Telephone Encounter (Signed)
Received a letter for Acknowledge of Receipt of an appeal from a denial of a AIM PA.  Authorization number is [id number] and Member ID is [id number] Case number is [phone] Left letter in providers box to look over.

## 2021-08-20 ENCOUNTER — Other Ambulatory Visit: Payer: Self-pay | Admitting: Neurological Surgery

## 2021-08-21 ENCOUNTER — Encounter (HOSPITAL_COMMUNITY): Payer: Self-pay | Admitting: Neurological Surgery

## 2021-08-21 ENCOUNTER — Other Ambulatory Visit (HOSPITAL_COMMUNITY)
Admission: RE | Admit: 2021-08-21 | Discharge: 2021-08-21 | Disposition: A | Discharge status: Home / Self Care | Payer: 59 | Source: Ambulatory Visit | Attending: Neurological Surgery | Admitting: Neurological Surgery

## 2021-08-21 DIAGNOSIS — Z01818 Encounter for other preprocedural examination: Secondary | ICD-10-CM

## 2021-08-21 DIAGNOSIS — Z20822 Contact with and (suspected) exposure to covid-19: Secondary | ICD-10-CM | POA: Diagnosis not present

## 2021-08-21 DIAGNOSIS — Z01812 Encounter for preprocedural laboratory examination: Secondary | ICD-10-CM | POA: Insufficient documentation

## 2021-08-21 LAB — SARS CORONAVIRUS 2 (TAT 6-24 HRS): SARS Coronavirus 2: NEGATIVE

## 2021-08-21 NOTE — Progress Notes (Signed)
DUE TO COVID-19 ONLY ONE VISITOR IS ALLOWED TO COME WITH YOU AND STAY IN THE WAITING ROOM ONLY DURING PRE OP AND PROCEDURE DAY OF SURGERY.   Two  VISITORS  MAY VISIT WITH YOU AFTER SURGERY IN YOUR PRIVATE ROOM DURING VISITING HOURS ONLY!  PCP - Enid Skeens, NP Cardiologist - n/a  Chest x-ray - n/a EKG - n/a Stress Test - n/a ECHO - n/a Cardiac Cath - n/a  ICD Pacemaker/Loop - n/a  Sleep Study -  n/a CPAP - none  ERAS: Clear liquids til 9 am on day of surgery  STOP now taking any Aspirin (unless otherwise instructed by your surgeon), Aleve, Naproxen, Ibuprofen, Motrin, Advil, Goody's, BC's, all herbal medications, fish oil, and all vitamins.   Coronavirus Screening Covid test on 08/21/21 was negative. Do you have any of the following symptoms:  Cough yes/no: No Fever (>100.63F)  yes/no: No Runny nose yes/no: No Sore throat yes/no: No Difficulty breathing/shortness of breath  yes/no: No  Have you traveled in the last 14 days and where? yes/no: No  Patient verbalized understanding of instructions that were given via phone.

## 2021-08-23 ENCOUNTER — Ambulatory Visit (HOSPITAL_COMMUNITY): Admission: RE | Admit: 2021-08-23 | Payer: 59 | Source: Home / Self Care | Admitting: Neurological Surgery

## 2021-08-23 HISTORY — DX: Gastro-esophageal reflux disease without esophagitis: K21.9

## 2021-08-23 SURGERY — LUMBAR LAMINECTOMY/ DECOMPRESSION WITH MET-RX
Anesthesia: General | Laterality: Right

## 2021-09-04 ENCOUNTER — Other Ambulatory Visit: Payer: Self-pay | Admitting: Neurological Surgery

## 2021-09-11 ENCOUNTER — Other Ambulatory Visit: Payer: Self-pay

## 2021-09-11 ENCOUNTER — Encounter (HOSPITAL_COMMUNITY): Payer: Self-pay | Admitting: Neurological Surgery

## 2021-09-11 NOTE — Progress Notes (Signed)
PCP - NP S. Early  Cardiologist - Denies  EP-Denies  Endocrine-Denies  Pulm-Denies  Chest x-ray - Denies  EKG - Denies  Stress Test - Denies  ECHO - Denies  Cardiac Cath - Denies  AICD-na PM-na LOOP-na  Dialysis-Denies  Sleep Study - Denies CPAP - Denies  LABS-09/12/21: CBC, COVID, PCR, POC UPreg  ASA-Denies  ERAS- Yes- clears until 0900  HA1C-Denies  Anesthesia- No  Pt denies having chest pain, sob, or fever during the pre-op phone call. All instructions explained to the pt, with a verbal understanding of the material including: as of today, stop taking all Aspirin (unless instructed by your doctor) and Other Aspirin containing products, Vitamins, Fish oils, and Herbal medications. Also stop all NSAIDS i.e. Advil, Ibuprofen, Motrin, Aleve, Anaprox, Naproxen, BC, Goody Powders, and all Supplements. Pt also instructed to wear a mask and social distance if she has to go out before surgery. The opportunity to ask questions was provided.    Coronavirus Screening  Have you experienced the following symptoms:  Cough yes/no: No Fever (>100.15F)  yes/no: No Runny nose yes/no: No Sore throat yes/no: No Difficulty breathing/shortness of breath  yes/no: No  Have you or a family member traveled in the last 14 days and where? yes/no: No   If the patient indicates "YES" to the above questions, their PAT will be rescheduled to limit the exposure to others and, the surgeon will be notified. THE PATIENT WILL NEED TO BE ASYMPTOMATIC FOR 14 DAYS.   If the patient is not experiencing any of these symptoms, the PAT nurse will instruct them to NOT bring anyone with them to their appointment since they may have these symptoms or traveled as well.   Please remind your patients and families that hospital visitation restrictions are in effect and the importance of the restrictions.

## 2021-09-12 ENCOUNTER — Ambulatory Visit (HOSPITAL_COMMUNITY): Payer: BC Managed Care – PPO

## 2021-09-12 ENCOUNTER — Encounter (HOSPITAL_COMMUNITY)
Admission: RE | Disposition: A | Discharge status: Home / Self Care | Payer: Self-pay | Source: Home / Self Care | Attending: Neurological Surgery

## 2021-09-12 ENCOUNTER — Ambulatory Visit (HOSPITAL_COMMUNITY): Payer: BC Managed Care – PPO | Admitting: Anesthesiology

## 2021-09-12 ENCOUNTER — Ambulatory Visit (HOSPITAL_COMMUNITY)
Admission: RE | Admit: 2021-09-12 | Discharge: 2021-09-12 | Disposition: A | Discharge status: Home / Self Care | Payer: BC Managed Care – PPO | Attending: Neurological Surgery | Admitting: Neurological Surgery

## 2021-09-12 ENCOUNTER — Encounter (HOSPITAL_COMMUNITY): Payer: Self-pay | Admitting: Neurological Surgery

## 2021-09-12 DIAGNOSIS — K219 Gastro-esophageal reflux disease without esophagitis: Secondary | ICD-10-CM | POA: Insufficient documentation

## 2021-09-12 DIAGNOSIS — Z87891 Personal history of nicotine dependence: Secondary | ICD-10-CM | POA: Insufficient documentation

## 2021-09-12 DIAGNOSIS — M5117 Intervertebral disc disorders with radiculopathy, lumbosacral region: Secondary | ICD-10-CM | POA: Insufficient documentation

## 2021-09-12 DIAGNOSIS — Z20822 Contact with and (suspected) exposure to covid-19: Secondary | ICD-10-CM | POA: Diagnosis not present

## 2021-09-12 DIAGNOSIS — Z419 Encounter for procedure for purposes other than remedying health state, unspecified: Secondary | ICD-10-CM

## 2021-09-12 HISTORY — PX: LUMBAR LAMINECTOMY/ DECOMPRESSION WITH MET-RX: SHX5959

## 2021-09-12 LAB — CBC
HCT: 40.6 % (ref 36.0–46.0)
Hemoglobin: 13.4 g/dL (ref 12.0–15.0)
MCH: 30.7 pg (ref 26.0–34.0)
MCHC: 33 g/dL (ref 30.0–36.0)
MCV: 93.1 fL (ref 80.0–100.0)
Platelets: 208 10*3/uL (ref 150–400)
RBC: 4.36 MIL/uL (ref 3.87–5.11)
RDW: 12 % (ref 11.5–15.5)
WBC: 5.3 10*3/uL (ref 4.0–10.5)
nRBC: 0 % (ref 0.0–0.2)

## 2021-09-12 LAB — SARS CORONAVIRUS 2 BY RT PCR (HOSPITAL ORDER, PERFORMED IN ~~LOC~~ HOSPITAL LAB): SARS Coronavirus 2: NEGATIVE

## 2021-09-12 LAB — POCT PREGNANCY, URINE: Preg Test, Ur: NEGATIVE

## 2021-09-12 LAB — SURGICAL PCR SCREEN
MRSA, PCR: NEGATIVE
Staphylococcus aureus: NEGATIVE

## 2021-09-12 SURGERY — LUMBAR LAMINECTOMY/ DECOMPRESSION WITH MET-RX
Anesthesia: General | Laterality: Right

## 2021-09-12 MED ORDER — HEMOSTATIC AGENTS (NO CHARGE) OPTIME
TOPICAL | Status: DC | PRN
  Administered 2021-09-12: 1 via TOPICAL

## 2021-09-12 MED ORDER — BUPIVACAINE-EPINEPHRINE (PF) 0.5% -1:200000 IJ SOLN
INTRAMUSCULAR | Status: DC | PRN
  Administered 2021-09-12: 5 mL

## 2021-09-12 MED ORDER — FENTANYL CITRATE (PF) 100 MCG/2ML IJ SOLN
INTRAMUSCULAR | Status: DC | PRN
Start: 2021-09-12 — End: 2021-09-12
  Administered 2021-09-12: 50 ug via INTRAVENOUS

## 2021-09-12 MED ORDER — LIDOCAINE-EPINEPHRINE 1 %-1:100000 IJ SOLN
INTRAMUSCULAR | Status: AC
  Filled 2021-09-12: qty 1

## 2021-09-12 MED ORDER — LIDOCAINE 2% (20 MG/ML) 5 ML SYRINGE
INTRAMUSCULAR | Status: DC | PRN
  Administered 2021-09-12: 100 mg via INTRAVENOUS

## 2021-09-12 MED ORDER — PROPOFOL 10 MG/ML IV BOLUS
INTRAVENOUS | Status: DC | PRN
  Administered 2021-09-12: 130 mg via INTRAVENOUS

## 2021-09-12 MED ORDER — THROMBIN 5000 UNITS EX SOLR
CUTANEOUS | Status: AC
  Filled 2021-09-12: qty 5000

## 2021-09-12 MED ORDER — AMISULPRIDE (ANTIEMETIC) 5 MG/2ML IV SOLN
INTRAVENOUS | Status: AC
  Filled 2021-09-12: qty 4

## 2021-09-12 MED ORDER — PROPOFOL 10 MG/ML IV BOLUS
INTRAVENOUS | Status: AC
  Filled 2021-09-12: qty 20

## 2021-09-12 MED ORDER — HYDROCODONE-ACETAMINOPHEN 10-325 MG PO TABS
1.0000 | ORAL_TABLET | ORAL | 0 refills | Status: DC | PRN
Start: 2021-09-12 — End: 2022-06-19

## 2021-09-12 MED ORDER — THROMBIN 5000 UNITS EX SOLR
OROMUCOSAL | Status: DC | PRN
  Administered 2021-09-12: 5 mL via TOPICAL

## 2021-09-12 MED ORDER — ORAL CARE MOUTH RINSE: 15.0000 mL | Freq: Once | OROMUCOSAL | Status: AC

## 2021-09-12 MED ORDER — DEXAMETHASONE SODIUM PHOSPHATE 10 MG/ML IJ SOLN
INTRAMUSCULAR | Status: DC | PRN
  Administered 2021-09-12: 10 mg via INTRAVENOUS

## 2021-09-12 MED ORDER — METHYLPREDNISOLONE ACETATE 80 MG/ML IJ SUSP
INTRAMUSCULAR | Status: AC
  Filled 2021-09-12: qty 1

## 2021-09-12 MED ORDER — DEXAMETHASONE SODIUM PHOSPHATE 10 MG/ML IJ SOLN
INTRAMUSCULAR | Status: AC
  Filled 2021-09-12: qty 1

## 2021-09-12 MED ORDER — FENTANYL CITRATE (PF) 250 MCG/5ML IJ SOLN
INTRAMUSCULAR | Status: DC | PRN
  Administered 2021-09-12 (×2): 50 ug via INTRAVENOUS
  Administered 2021-09-12: 150 ug via INTRAVENOUS

## 2021-09-12 MED ORDER — CHLORHEXIDINE GLUCONATE CLOTH 2 % EX PADS: 6.0000 | MEDICATED_PAD | Freq: Once | CUTANEOUS | Status: DC

## 2021-09-12 MED ORDER — PROMETHAZINE HCL 25 MG/ML IJ SOLN: 6.2500 mg | INTRAMUSCULAR | Status: DC | PRN

## 2021-09-12 MED ORDER — MIDAZOLAM HCL 5 MG/5ML IJ SOLN
INTRAMUSCULAR | Status: DC | PRN
  Administered 2021-09-12: 2 mg via INTRAVENOUS

## 2021-09-12 MED ORDER — CHLORHEXIDINE GLUCONATE 0.12 % MT SOLN
15.0000 mL | Freq: Once | OROMUCOSAL | Status: AC
  Administered 2021-09-12: 15 mL via OROMUCOSAL
  Filled 2021-09-12: qty 15

## 2021-09-12 MED ORDER — ONDANSETRON HCL 4 MG/2ML IJ SOLN
INTRAMUSCULAR | Status: AC
  Filled 2021-09-12: qty 2

## 2021-09-12 MED ORDER — FENTANYL CITRATE (PF) 100 MCG/2ML IJ SOLN
INTRAMUSCULAR | Status: AC
  Filled 2021-09-12: qty 2

## 2021-09-12 MED ORDER — AMISULPRIDE (ANTIEMETIC) 5 MG/2ML IV SOLN
10.0000 mg | Freq: Once | INTRAVENOUS | Status: AC | PRN
  Administered 2021-09-12: 10 mg via INTRAVENOUS

## 2021-09-12 MED ORDER — LACTATED RINGERS IV SOLN: INTRAVENOUS | Status: DC

## 2021-09-12 MED ORDER — METHOCARBAMOL 500 MG PO TABS: 500.0000 mg | ORAL_TABLET | Freq: Four times a day (QID) | ORAL | 1 refills | Status: DC | PRN

## 2021-09-12 MED ORDER — CEFAZOLIN SODIUM-DEXTROSE 2-4 GM/100ML-% IV SOLN
2.0000 g | INTRAVENOUS | Status: AC
  Administered 2021-09-12: 2 g via INTRAVENOUS
  Filled 2021-09-12: qty 100

## 2021-09-12 MED ORDER — FENTANYL CITRATE (PF) 100 MCG/2ML IJ SOLN
25.0000 ug | INTRAMUSCULAR | Status: DC | PRN
  Administered 2021-09-12 (×2): 50 ug via INTRAVENOUS

## 2021-09-12 MED ORDER — SUGAMMADEX SODIUM 200 MG/2ML IV SOLN
INTRAVENOUS | Status: DC | PRN
  Administered 2021-09-12: 200 mg via INTRAVENOUS

## 2021-09-12 MED ORDER — ROCURONIUM BROMIDE 10 MG/ML (PF) SYRINGE
PREFILLED_SYRINGE | INTRAVENOUS | Status: DC | PRN
  Administered 2021-09-12: 100 mg via INTRAVENOUS

## 2021-09-12 MED ORDER — LIDOCAINE-EPINEPHRINE 1 %-1:100000 IJ SOLN
INTRAMUSCULAR | Status: DC | PRN
  Administered 2021-09-12: 5 mL

## 2021-09-12 MED ORDER — BUPIVACAINE-EPINEPHRINE 0.5% -1:200000 IJ SOLN
INTRAMUSCULAR | Status: AC
  Filled 2021-09-12: qty 1

## 2021-09-12 MED ORDER — ACETAMINOPHEN 500 MG PO TABS
1000.0000 mg | ORAL_TABLET | Freq: Once | ORAL | Status: AC
  Administered 2021-09-12: 1000 mg via ORAL

## 2021-09-12 MED ORDER — 0.9 % SODIUM CHLORIDE (POUR BTL) OPTIME
TOPICAL | Status: DC | PRN
  Administered 2021-09-12: 1000 mL

## 2021-09-12 MED ORDER — ROCURONIUM BROMIDE 10 MG/ML (PF) SYRINGE
PREFILLED_SYRINGE | INTRAVENOUS | Status: AC
  Filled 2021-09-12: qty 10

## 2021-09-12 MED ORDER — FENTANYL CITRATE (PF) 250 MCG/5ML IJ SOLN
INTRAMUSCULAR | Status: AC
  Filled 2021-09-12: qty 5

## 2021-09-12 MED ORDER — ONDANSETRON HCL 4 MG/2ML IJ SOLN
INTRAMUSCULAR | Status: DC | PRN
  Administered 2021-09-12: 4 mg via INTRAVENOUS

## 2021-09-12 MED ORDER — MIDAZOLAM HCL 2 MG/2ML IJ SOLN
INTRAMUSCULAR | Status: AC
  Filled 2021-09-12: qty 2

## 2021-09-12 MED ORDER — METHYLPREDNISOLONE ACETATE 80 MG/ML IJ SUSP
INTRAMUSCULAR | Status: DC | PRN
  Administered 2021-09-12: 80 mg

## 2021-09-12 MED ORDER — SCOPOLAMINE 1 MG/3DAYS TD PT72
1.0000 | MEDICATED_PATCH | TRANSDERMAL | Status: DC
  Administered 2021-09-12: 1.5 mg via TRANSDERMAL

## 2021-09-12 MED ORDER — OXYCODONE-ACETAMINOPHEN 7.5-325 MG PO TABS: 1.0000 | ORAL_TABLET | ORAL | 0 refills | Status: DC | PRN

## 2021-09-12 MED ORDER — LIDOCAINE 2% (20 MG/ML) 5 ML SYRINGE
INTRAMUSCULAR | Status: AC
  Filled 2021-09-12: qty 5

## 2021-09-12 SURGICAL SUPPLY — 60 items
BAG COUNTER SPONGE SURGICOUNT (BAG) ×4 IMPLANT
BAND RUBBER #18 3X1/16 STRL (MISCELLANEOUS) ×4 IMPLANT
BLADE SURG 11 STRL SS (BLADE) IMPLANT
BUR MATCHSTICK NEURO 3.0 LAGG (BURR) ×2 IMPLANT
BUR RND OSTEON ELITE 6.0 (BURR) ×2 IMPLANT
CARTRIDGE OIL MAESTRO DRILL (MISCELLANEOUS) IMPLANT
CNTNR URN SCR LID CUP LEK RST (MISCELLANEOUS) ×1 IMPLANT
CONT SPEC 4OZ STRL OR WHT (MISCELLANEOUS) ×2
COVER BACK TABLE 60X90IN (DRAPES) IMPLANT
COVER MAYO STAND STRL (DRAPES) ×2 IMPLANT
DECANTER SPIKE VIAL GLASS SM (MISCELLANEOUS) IMPLANT
DERMABOND ADVANCED (GAUZE/BANDAGES/DRESSINGS) ×1
DERMABOND ADVANCED .7 DNX12 (GAUZE/BANDAGES/DRESSINGS) ×1 IMPLANT
DIFFUSER DRILL AIR PNEUMATIC (MISCELLANEOUS) IMPLANT
DRAIN JACKSON RD 7FR 3/32 (WOUND CARE) IMPLANT
DRAPE C-ARM 42X72 X-RAY (DRAPES) ×2 IMPLANT
DRAPE LAPAROTOMY 100X72X124 (DRAPES) ×2 IMPLANT
DRAPE MICROSCOPE LEICA (MISCELLANEOUS) ×2 IMPLANT
DRAPE SURG 17X23 STRL (DRAPES) ×2 IMPLANT
DRSG OPSITE POSTOP 3X4 (GAUZE/BANDAGES/DRESSINGS) ×2 IMPLANT
DURAPREP 26ML APPLICATOR (WOUND CARE) ×2 IMPLANT
ELECT BLADE 6.5 EXT (BLADE) ×2 IMPLANT
ELECT BLADE INSULATED 6.5IN (ELECTROSURGICAL) ×2
ELECT REM PT RETURN 9FT ADLT (ELECTROSURGICAL) ×2
ELECTRODE BLDE INSULATED 6.5IN (ELECTROSURGICAL) ×1 IMPLANT
ELECTRODE REM PT RTRN 9FT ADLT (ELECTROSURGICAL) ×1 IMPLANT
GAUZE 4X4 16PLY ~~LOC~~+RFID DBL (SPONGE) ×2 IMPLANT
GLOVE SRG 8 PF TXTR STRL LF DI (GLOVE) ×2 IMPLANT
GLOVE SURG LTX SZ8 (GLOVE) ×4 IMPLANT
GLOVE SURG UNDER POLY LF SZ8 (GLOVE) ×4
GOWN STRL REUS W/ TWL LRG LVL3 (GOWN DISPOSABLE) IMPLANT
GOWN STRL REUS W/ TWL XL LVL3 (GOWN DISPOSABLE) ×1 IMPLANT
GOWN STRL REUS W/TWL 2XL LVL3 (GOWN DISPOSABLE) IMPLANT
GOWN STRL REUS W/TWL LRG LVL3 (GOWN DISPOSABLE)
GOWN STRL REUS W/TWL XL LVL3 (GOWN DISPOSABLE) ×2
HEMOSTAT POWDER KIT SURGIFOAM (HEMOSTASIS) ×2 IMPLANT
KIT BASIN OR (CUSTOM PROCEDURE TRAY) ×2 IMPLANT
KIT POSITION SURG JACKSON T1 (MISCELLANEOUS) ×2 IMPLANT
KIT TURNOVER KIT B (KITS) ×2 IMPLANT
MARKER SKIN DUAL TIP RULER LAB (MISCELLANEOUS) ×2 IMPLANT
NEEDLE HYPO 25X1 1.5 SAFETY (NEEDLE) ×2 IMPLANT
NEEDLE SPNL 18GX3.5 QUINCKE PK (NEEDLE) ×2 IMPLANT
NS IRRIG 1000ML POUR BTL (IV SOLUTION) ×2 IMPLANT
OIL CARTRIDGE MAESTRO DRILL (MISCELLANEOUS)
PACK LAMINECTOMY NEURO (CUSTOM PROCEDURE TRAY) ×2 IMPLANT
PAD ARMBOARD 7.5X6 YLW CONV (MISCELLANEOUS) ×6 IMPLANT
PATTIES SURGICAL .5 X.5 (GAUZE/BANDAGES/DRESSINGS) IMPLANT
PATTIES SURGICAL .5 X1 (DISPOSABLE) IMPLANT
PATTIES SURGICAL 1X1 (DISPOSABLE) IMPLANT
SPONGE SURGIFOAM ABS GEL 12-7 (HEMOSTASIS) IMPLANT
SPONGE T-LAP 4X18 ~~LOC~~+RFID (SPONGE) ×2 IMPLANT
STAPLER VISISTAT 35W (STAPLE) IMPLANT
SUT VIC AB 0 CT1 27 (SUTURE) ×2
SUT VIC AB 0 CT1 27XBRD ANBCTR (SUTURE) ×1 IMPLANT
SUT VIC AB 2-0 CP2 18 (SUTURE) ×2 IMPLANT
SUT VIC AB 3-0 SH 8-18 (SUTURE) ×2 IMPLANT
TOWEL GREEN STERILE (TOWEL DISPOSABLE) IMPLANT
TOWEL GREEN STERILE FF (TOWEL DISPOSABLE) IMPLANT
TRAY FOLEY MTR SLVR 16FR STAT (SET/KITS/TRAYS/PACK) IMPLANT
WATER STERILE IRR 1000ML POUR (IV SOLUTION) ×2 IMPLANT

## 2021-09-12 NOTE — Anesthesia Preprocedure Evaluation (Addendum)
Anesthesia Evaluation  Patient identified by MRN, date of birth, ID band Patient awake    Reviewed: Allergy & Precautions, Patient's Chart, lab work & pertinent test results  Airway Mallampati: II       Dental no notable dental hx.    Pulmonary former smoker,    Pulmonary exam normal        Cardiovascular negative cardio ROS   Rhythm:Regular Rate:Normal     Neuro/Psych negative neurological ROS     GI/Hepatic Neg liver ROS, GERD  Medicated,  Endo/Other  negative endocrine ROS  Renal/GU negative Renal ROS     Musculoskeletal negative musculoskeletal ROS (+)   Abdominal Normal abdominal exam  (+)   Peds  Hematology negative hematology ROS (+)   Anesthesia Other Findings   Reproductive/Obstetrics                            Anesthesia Physical Anesthesia Plan  ASA: 2  Anesthesia Plan: General   Post-op Pain Management:    Induction: Intravenous  PONV Risk Score and Plan: 4 or greater and Ondansetron, Dexamethasone, Midazolam and Scopolamine patch - Pre-op  Airway Management Planned: Oral ETT  Additional Equipment:   Intra-op Plan:   Post-operative Plan: Extubation in OR  Informed Consent: I have reviewed the patients History and Physical, chart, labs and discussed the procedure including the risks, benefits and alternatives for the proposed anesthesia with the patient or authorized representative who has indicated his/her understanding and acceptance.     Dental advisory given  Plan Discussed with: Anesthesiologist  Anesthesia Plan Comments:        Anesthesia Quick Evaluation

## 2021-09-12 NOTE — Anesthesia Procedure Notes (Signed)
Procedure Name: Intubation Date/Time: 09/12/2021 6:06 PM Performed by: Reece Agar, CRNA Pre-anesthesia Checklist: Patient identified, Emergency Drugs available, Suction available and Patient being monitored Patient Re-evaluated:Patient Re-evaluated prior to induction Oxygen Delivery Method: Circle System Utilized Preoxygenation: Pre-oxygenation with 100% oxygen Induction Type: IV induction Ventilation: Mask ventilation without difficulty Laryngoscope Size: Mac and 3 Grade View: Grade I Tube type: Oral Tube size: 7.0 mm Number of attempts: 1 Airway Equipment and Method: Stylet Placement Confirmation: ETT inserted through vocal cords under direct vision, positive ETCO2 and breath sounds checked- equal and bilateral Secured at: 21 cm Tube secured with: Tape Dental Injury: Teeth and Oropharynx as per pre-operative assessment

## 2021-09-12 NOTE — H&P (Signed)
Providing Compassionate, Quality Care - Together  NEUROSURGERY HISTORY & PHYSICAL   Jenna Leonard is an 35 y.o. adult.   Chief Complaint: Worsening right lower extremity pain and weakness HPI: This is a pleasant 35 year old female with a history of a right L5-S1 microdiscectomy approximately August 2022 that presented a few weeks ago with worsening right leg pain and weakness.  Repeat MRI showed a large recurrent herniated nucleus pulposus with recurrent compression of the right S1 nerve root.  Given her severe pain and difficulty walking and alteration of lifestyle, I recommended intervention in the form of a recurrent right L5-S1 microdiscectomy.  She presents today for this, states she has had progressive worsening pain and difficulty walking.  She denies any bowel or bladder changes or any groin numbness.  Past Medical History:  Diagnosis Date   Anxiety    Anxiety state 02/07/2013   Formatting of this note might be different from the original. IMO update 2017  Last Assessment & Plan:  Formatting of this note might be different from the original. Patient reassured.  Discussed possible medication options.  She is not interested in medication at this time.  She is going to continue with counseling.  She will focus on yoga and breathing.  She will also do some meditation.   Bartholin's gland abscess 05/18/2013   Cramp in lower leg 02/14/2021   Cyst of Bartholin's gland duct 02/14/2021   Dysuria 11/04/2013   Gardnerella vaginitis 02/14/2021   GERD (gastroesophageal reflux disease)    Leg pain 10/19/2013   Last Assessment & Plan:  Formatting of this note might be different from the original. Will send for u/s to r/o DVT.  Pt reassured that DVT is unlikely.   Other chest pain 02/07/2013   Formatting of this note might be different from the original. 02/07/13 - Sx of chest pain and arm tightness. Low cardiac risk and EKG normal  today. No risk factors for pulmonary disease/PE. Likely related  to increased  stressors and anxiety. Pt reassured regarding cardiac findings of normal EKG.  Rx provided for Lexapro for anxiety. Benzo offered but declined for acute  attacks. Continue counseling   Pain in joint, pelvic region and thigh 11/11/2011   Last Assessment & Plan:  Formatting of this note might be different from the original. Achiness is somewhat vague, at first I thought due to anxiety and psychosomatic complaints, however on exam she does have some mild weakness.  Neurology consultation reasonable.  Referral done.   Panic attack 02/14/2021   Pityriasis versicolor 11/12/2012   Formatting of this note might be different from the original. 11/12/12 - Tx options reviewed. Given extensive surface area, will re-treat  wiht oral therapy. I/SE reviewed. Avoid alcohol while taking so as to protect  the liver. Also advised topical Selsun-Blue weekly as a body wash.   Pyelonephritis 11/04/2013   Last Assessment & Plan:  Formatting of this note might be different from the original. Symptoms, UA and exam very consistent with pyelonephritis; see below; will send off culture and labs per orders; currently stable so can treat as outpatient but per below discussed symptoms for which to go to the ER and counseled this could be a serious infection    Past Surgical History:  Procedure Laterality Date   TONSILECTOMY/ADENOIDECTOMY WITH MYRINGOTOMY Bilateral    TONSILLECTOMY      Family History  Problem Relation Age of Onset   Diabetes Mother    Hypertension Mother    Kidney disease  Mother    Obesity Mother    Breast cancer Mother    Lung cancer Father    Alzheimer's disease Maternal Grandmother    Social History:  reports that Jenna Leonard quit smoking about 4 years ago. Jenna Leonard's smoking use included cigarettes. Jenna Leonard has a 1.25 pack-year smoking history. Jenna Leonard has never used smokeless tobacco. Jenna Dandy B. Leonard reports current alcohol use of about 4.0 - 6.0 standard drinks  per week. Jenna Leonard reports current drug use. Drug: Marijuana.  Allergies:  Allergies  Allergen Reactions   Gabapentin Swelling and Other (See Comments)    Facial/tongue swelling   Sulfa Antibiotics Rash and Other (See Comments)    Medications Prior to Admission  Medication Sig Dispense Refill   famotidine (PEPCID) 20 MG tablet Take 20 mg by mouth 2 (two) times daily as needed for heartburn or indigestion.     norgestimate-ethinyl estradiol (ORTHO-CYCLEN) 0.25-35 MG-MCG tablet Take 1 tablet by mouth in the morning.     pregabalin (LYRICA) 50 MG capsule Take 50 mg by mouth 3 (three) times daily.     pantoprazole (PROTONIX) 40 MG tablet Take 1 tablet (40 mg total) by mouth daily. (Patient not taking: No sig reported) 30 tablet 3    Results for orders placed or performed during the hospital encounter of 09/12/21 (from the past 48 hour(s))  SARS Coronavirus 2 by RT PCR (hospital order, performed in [facility] hospital lab) Nasopharyngeal Nasopharyngeal Swab     Status: None   Collection Time: 09/12/21  9:37 AM   Specimen: Nasopharyngeal Swab  Result Value Ref Range   SARS Coronavirus 2 NEGATIVE NEGATIVE    Comment: (NOTE) SARS-CoV-2 target nucleic acids are NOT DETECTED.  The SARS-CoV-2 RNA is generally detectable in upper and lower respiratory specimens during the acute phase of infection. The lowest concentration of SARS-CoV-2 viral copies this assay can detect is 250 copies / mL. A negative result does not preclude SARS-CoV-2 infection and should not be used as the sole basis for treatment or other patient management decisions.  A negative result may occur with improper specimen collection / handling, submission of specimen other than nasopharyngeal swab, presence of viral mutation(s) within the areas targeted by this assay, and inadequate number of viral copies (<250 copies / mL). A negative result must be combined with clinical observations, patient history, and  epidemiological information.  Fact Sheet for Patients:   BoilerBrush.com.cy  Fact Sheet for Healthcare Providers: https://pope.com/  This test is not yet approved or  cleared by the [place] FDA and has been authorized for detection and/or diagnosis of SARS-CoV-2 by FDA under an Emergency Use Authorization (EUA).  This EUA will remain in effect (meaning this test can be used) for the duration of the COVID-19 declaration under Section 564(b)(1) of the Act, 21 U.S.C. section 360bbb-3(b)(1), unless the authorization is terminated or revoked sooner.  Performed at [facility], [address] [place], [place] [address]   CBC per protocol     Status: None   Collection Time: 09/12/21 11:00 AM  Result Value Ref Range   WBC 5.3 4.0 - 10.5 K/uL   RBC 4.36 3.87 - 5.11 MIL/uL   Hemoglobin 13.4 12.0 - 15.0 g/dL   HCT 99.8 33.8 - 25.0 %   MCV 93.1 80.0 - 100.0 fL   MCH 30.7 26.0 - 34.0 pg   MCHC 33.0 30.0 - 36.0 g/dL   RDW 53.9 76.7 - 34.1 %  Platelets 208 150 - 400 K/uL   nRBC 0.0 0.0 - 0.2 %    Comment: Performed at [facility], [address], [place], [place] [address]   No results found.  ROS All positives and negatives are listed HPI above  Blood pressure 127/79, pulse 65, temperature 98.8 F (37.1 C), temperature source Oral, resp. rate 18, height 5\' 7"  (1.702 m), weight 97.5 kg, last menstrual period 09/02/2021, SpO2 97 %. 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  We discussed  all risk, benefits and expected outcomes.  I answered all her questions.  She agrees to proceed with surgical intervention.   Thank you for allowing me to participate in this patient's care.  Please do not hesitate to call with questions or concerns.   34, DO Neurosurgeon [facility] Cell: [phone]

## 2021-09-12 NOTE — Transfer of Care (Signed)
Immediate Anesthesia Transfer of Care Note  Patient: Jenna Leonard  Procedure(s) Performed: Right Lumbar Five- Sacral One Minimally invasive microdiscectomy (Right)  Patient Location: PACU  Anesthesia Type:General  Level of Consciousness: awake, alert  and oriented  Airway & Oxygen Therapy: Patient Spontanous Breathing  Post-op Assessment: Report given to RN and Post -op Vital signs reviewed and stable  Post vital signs: Reviewed and stable  Last Vitals:  Vitals Value Taken Time  BP 105/47 09/12/21 2019  Temp    Pulse 81 09/12/21 2020  Resp 16 09/12/21 2020  SpO2 98 % 09/12/21 2020  Vitals shown include unvalidated device data.  Last Pain:  Vitals:   09/12/21 1049  TempSrc:   PainSc: 0-No pain      Patients Stated Pain Goal: 3 (09/11/21 1650)  Complications: No notable events documented.

## 2021-09-12 NOTE — Op Note (Signed)
Providing Compassionate, Quality Care - Together  Date of service: 09/12/2021  PREOP DIAGNOSIS: Lumbar disc herniation, right, L5-S1, recurrent  POSTOP DIAGNOSIS: Same  PROCEDURE: 1.  Right L5-S1 minimally invasive laminectomy, partial facetectomy and microdiscectomy for decompression of nerve root S1 2. Use of operating microscope 3. Use of intraoperative fluoroscopy  SURGEON: Dr. Kendell Bane C. Analynn Daum, DO  ASSISTANT: Docia Barrier, NP  ANESTHESIA: General Endotracheal  EBL: 20 cc  SPECIMENS: None  DRAINS: None  COMPLICATIONS: None  CONDITION: Hemodynamically stable  HISTORY: Jenna Leonard is a 35 y.o. adult who underwent an L5-S1 right microdiscectomy for S1 radiculopathy in August 2022 and did well initially postoperatively.  Approximately 6 weeks postop she began to have recurrent right leg pain with weakness.  Repeat MRI showed large recurrent disc herniation with tenting of the S1 nerve root.  We discussed continued conservative measures versus surgical intervention however given her slight weakness and resistance to pain medication, I recommend surgical intervention in the form of recurrent right microdiscectomy at L5-S1.  All risks, benefits and expected outcomes were discussed and agreed upon.  Informed consent was obtained.  PROCEDURE IN DETAIL: After informed consent was obtained and witnessed, the patient was brought to the operating room. After induction of general anesthesia, the patient was positioned on the operative table in the prone position with all pressure points meticulously padded. The skin of the low back was then prepped and draped in the usual sterile fashion. Physician driven timeout was performed.  Under fluoroscopy, the L 5-S1 level was identified and marked out on the skin, and after timeout was conducted. Skin incision was then made sharply with a 10 blade and Bovie electrocautery was used to dissect the subcutaneous tissue until the lumbodorsal fascia  was identified. The fascia was then incised using Bovie electrocautery and the lamina at the right levels was identified and dissection was carried out in the subperiosteal plane using the Metrx dilators.  An appropriate sized Metrx tube was placed, and intraoperative x-ray was taken to confirm appropriate placement.  The microscope was sterilely draped and brought into the field and used for the remainder of the case.  Using Bovie electrocautery, soft tissue was cleared from the lamina and medial facet.  The previous laminotomy site was identified.  Using a high-speed drill, a further laminotomy was completed with a partial medial facetectomy. The lateral edge of the thecal sac was identified. This was then traced down to identify the traversing nerve root. Dissection was then carried out superior and lateral to the nerve root to identify the disc herniation. The posterior annulus was then coagulated with bipolar cautery and incised with an 11 blade and using a combination of dissectors, curettes, and rongeurs, the herniated disc fragment was removed. The decompression of the nerve root was confirmed using a dissector.  There were multiple large recurrent disc fragments removed.  Hemostasis was then secured using a combination of morcellized Gelfoam and thrombin and bipolar electrocautery. The wound is irrigated with copious amounts of antibiotic saline irrigation.  The traversing and exiting nerve roots were felt with a Murphy ball probe and noted to be decompressed.  The wound was noted to be excellently hemostatic.  The nerve root was then covered with a long-acting steroid solution and fentanyl.  The Metrx tube was then removed, hemostasis was achieved with bipolar cautery and the wound is closed in layers using 2-0, 3-0 Vicryl stitches. The skin was closed using standard skin glue.  Sterile dressing was applied.  Drapes were taken down.  At the end of the case all sponge, needle, and instrument counts  were correct. The patient was then fully transferred to the stretcher, extubated and taken to the postanesthesia care unit in stable hemodynamic condition.

## 2021-09-13 ENCOUNTER — Encounter (HOSPITAL_COMMUNITY): Payer: Self-pay | Admitting: Neurological Surgery

## 2021-09-13 NOTE — Anesthesia Postprocedure Evaluation (Signed)
Anesthesia Post Note  Patient: Jenna Leonard  Procedure(s) Performed: Right Lumbar Five- Sacral One Minimally invasive microdiscectomy (Right)     Patient location during evaluation: PACU Anesthesia Type: General Level of consciousness: awake and alert Pain management: pain level controlled Vital Signs Assessment: post-procedure vital signs reviewed and stable Respiratory status: spontaneous breathing, nonlabored ventilation, respiratory function stable and patient connected to nasal cannula oxygen Cardiovascular status: blood pressure returned to baseline and stable Postop Assessment: no apparent nausea or vomiting Anesthetic complications: no   No notable events documented.  Last Vitals:  Vitals:   09/12/21 2120 09/12/21 2130  BP: 118/79 129/79  Pulse: 61 60  Resp: 16 13  Temp:    SpO2: 100% 98%    Last Pain:  Vitals:   09/12/21 2130  TempSrc:   PainSc: 5                  Dmiyah Liscano P Josaiah Muhammed

## 2021-10-01 ENCOUNTER — Encounter (HOSPITAL_BASED_OUTPATIENT_CLINIC_OR_DEPARTMENT_OTHER): Payer: Self-pay | Admitting: Nurse Practitioner

## 2021-10-01 MED ORDER — NORGESTIMATE-ETH ESTRADIOL 0.25-35 MG-MCG PO TABS: 1.0000 | ORAL_TABLET | Freq: Every morning | ORAL | 11 refills | Status: DC

## 2021-11-25 IMAGING — DX DG CERVICAL SPINE COMPLETE 4+V
6 series · 6 of 6 positions shown · non-contrast
Comparison: None.

CLINICAL DATA: Neck pain, initial encounter

EXAM:
CERVICAL SPINE - COMPLETE 4+ VIEW

[c-spine lat]
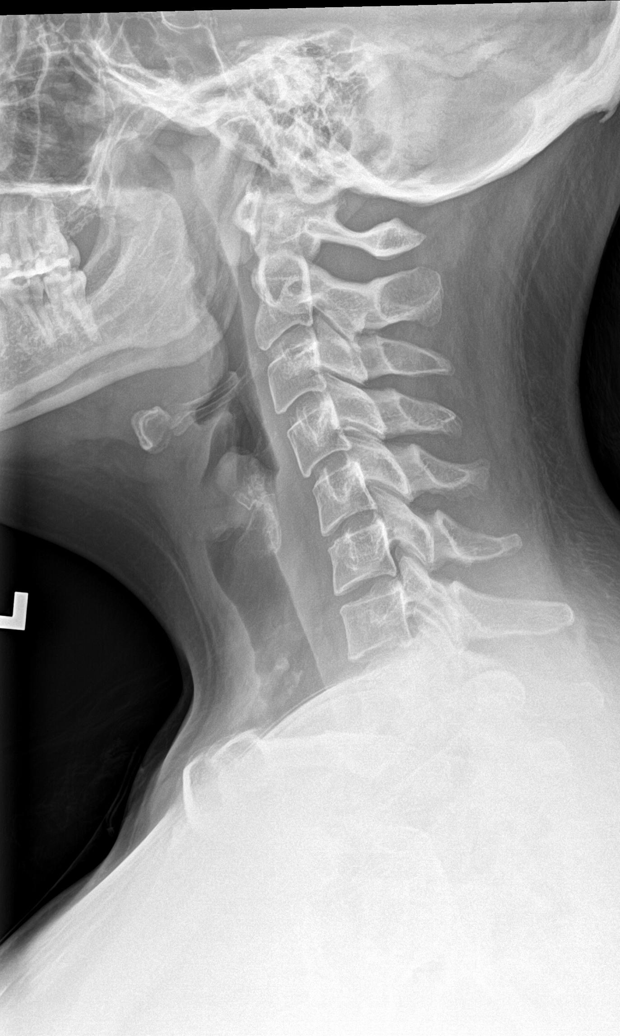

[c-spine obl (1 of 2)]
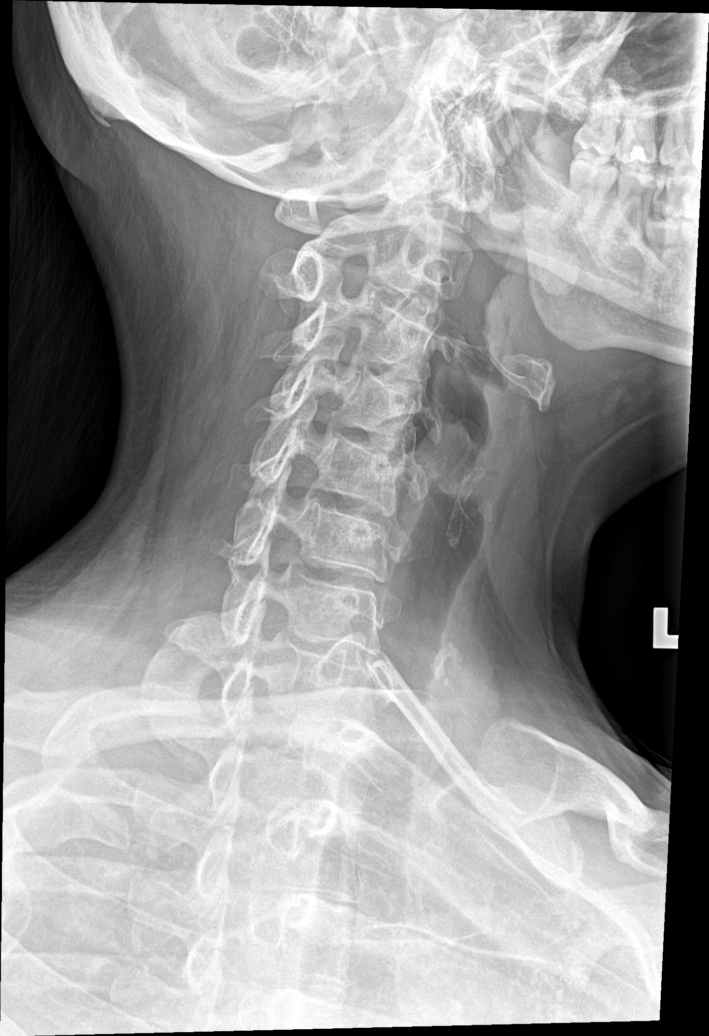

[c-spine obl (2 of 2)]
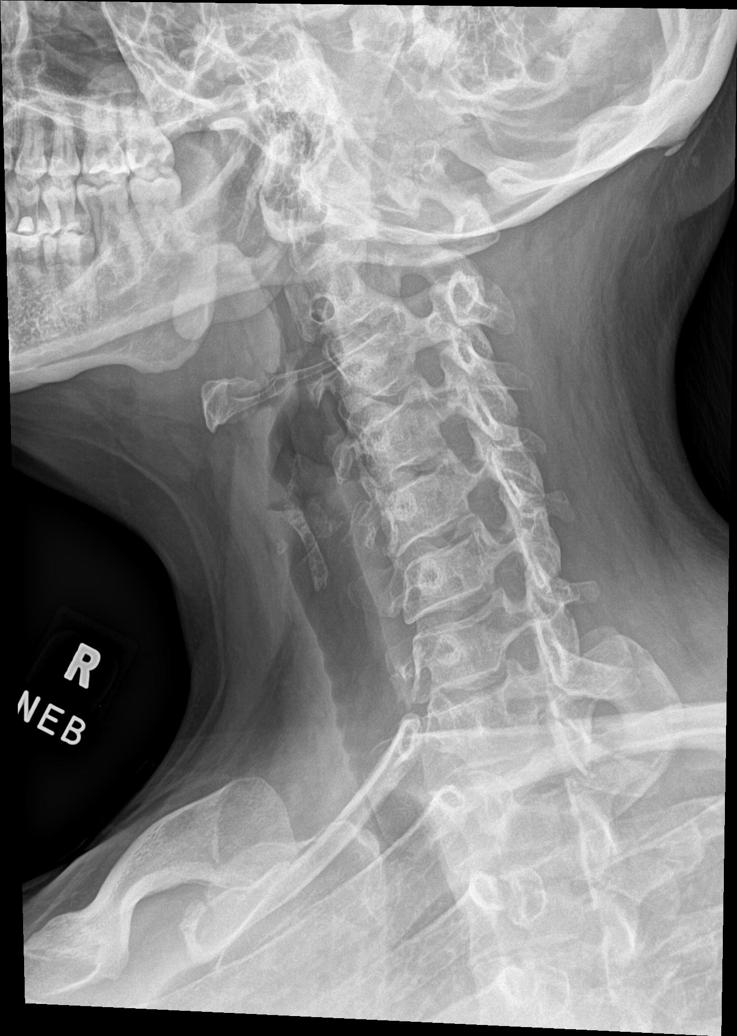

[c-spine ap (1 of 2)]
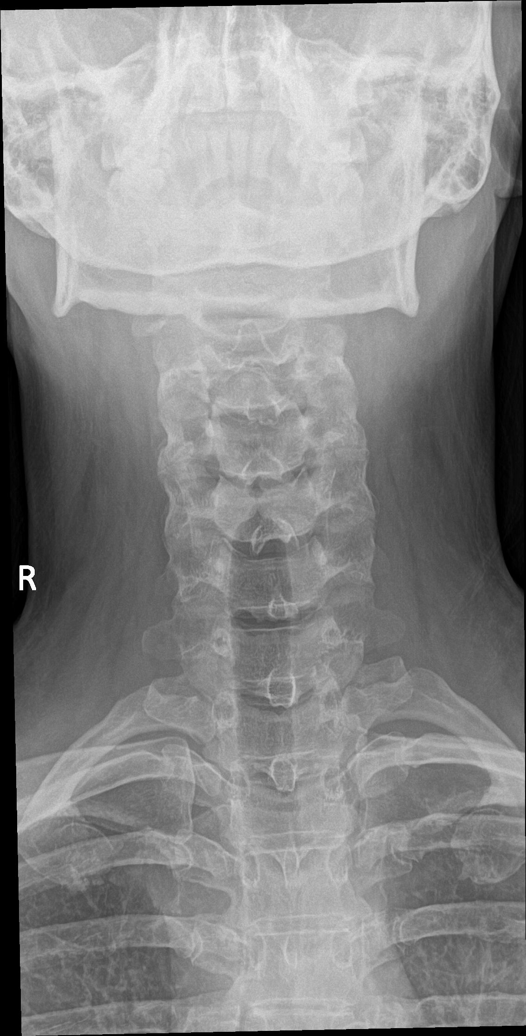

[c-spine open mouth]
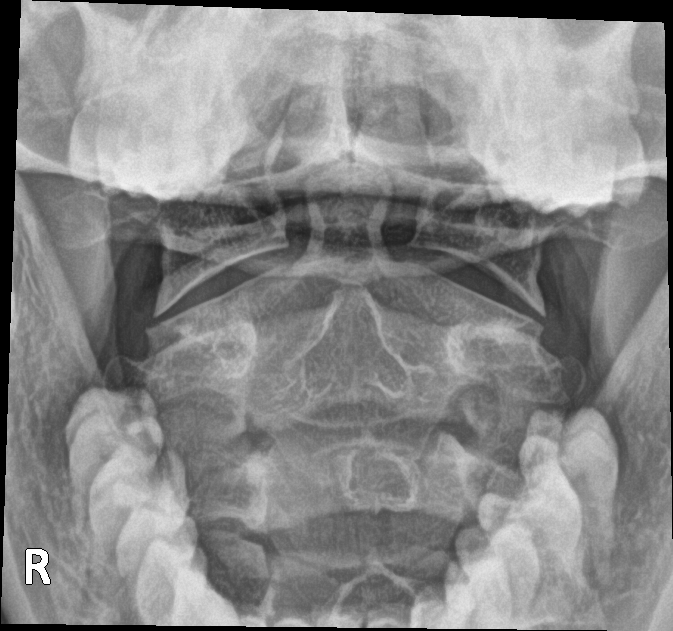

[c-spine ap (2 of 2)]
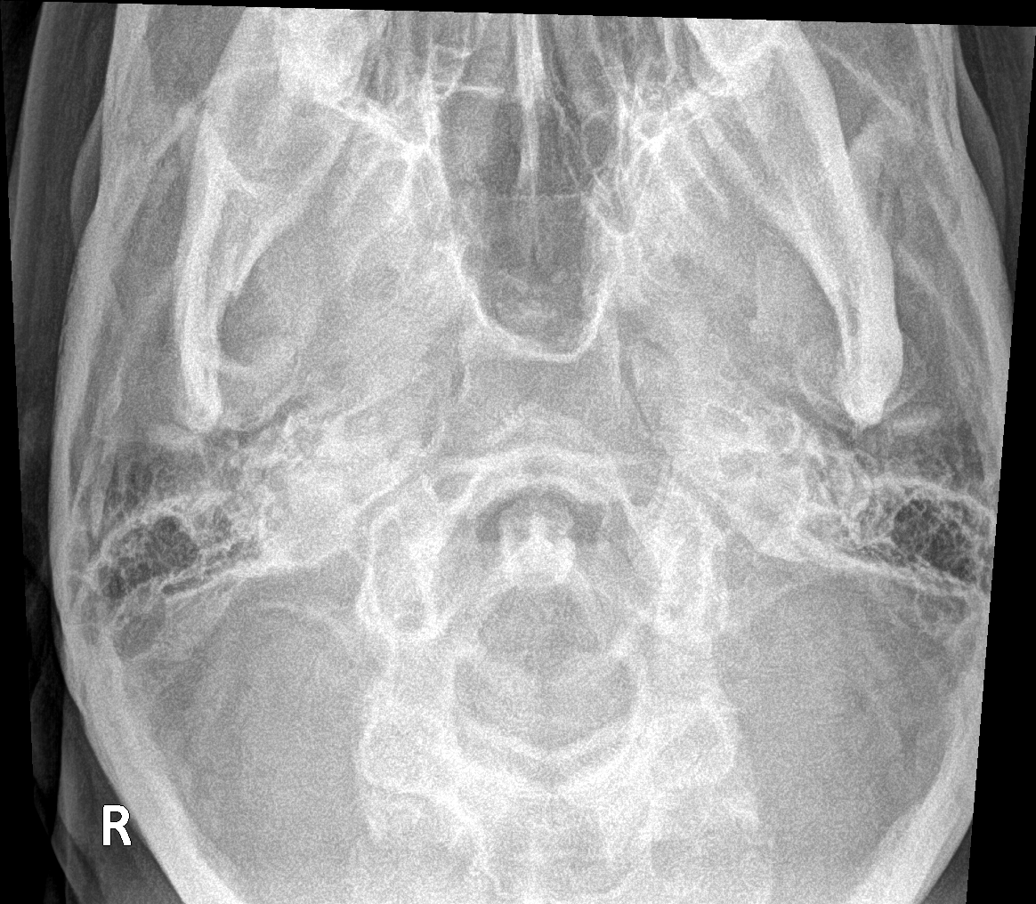

[6 of 6 positions shown; findings below may reference images not displayed]

FINDINGS: Mild loss of the normal cervical lordosis is noted. Seven cervical
segments are well visualized. Vertebral body height is well
maintained. No significant neural foraminal narrowing is noted. No
prevertebral soft tissue abnormality is seen. The odontoid is within
normal limits.
IMPRESSION: Mild loss of normal cervical lordosis. No acute bony abnormality is
seen.

## 2022-04-17 IMAGING — RF DG LUMBAR SPINE 2-3V
1 series · 1 of 1 positions shown · non-contrast
Comparison: Lumbar radiographs 04/22/2021

CLINICAL DATA: L5-S1 micro discectomy

EXAM:
LUMBAR SPINE - 2-3 VIEW

[Series 1: run · 1 of 1 slices shown]
[im 1/1]
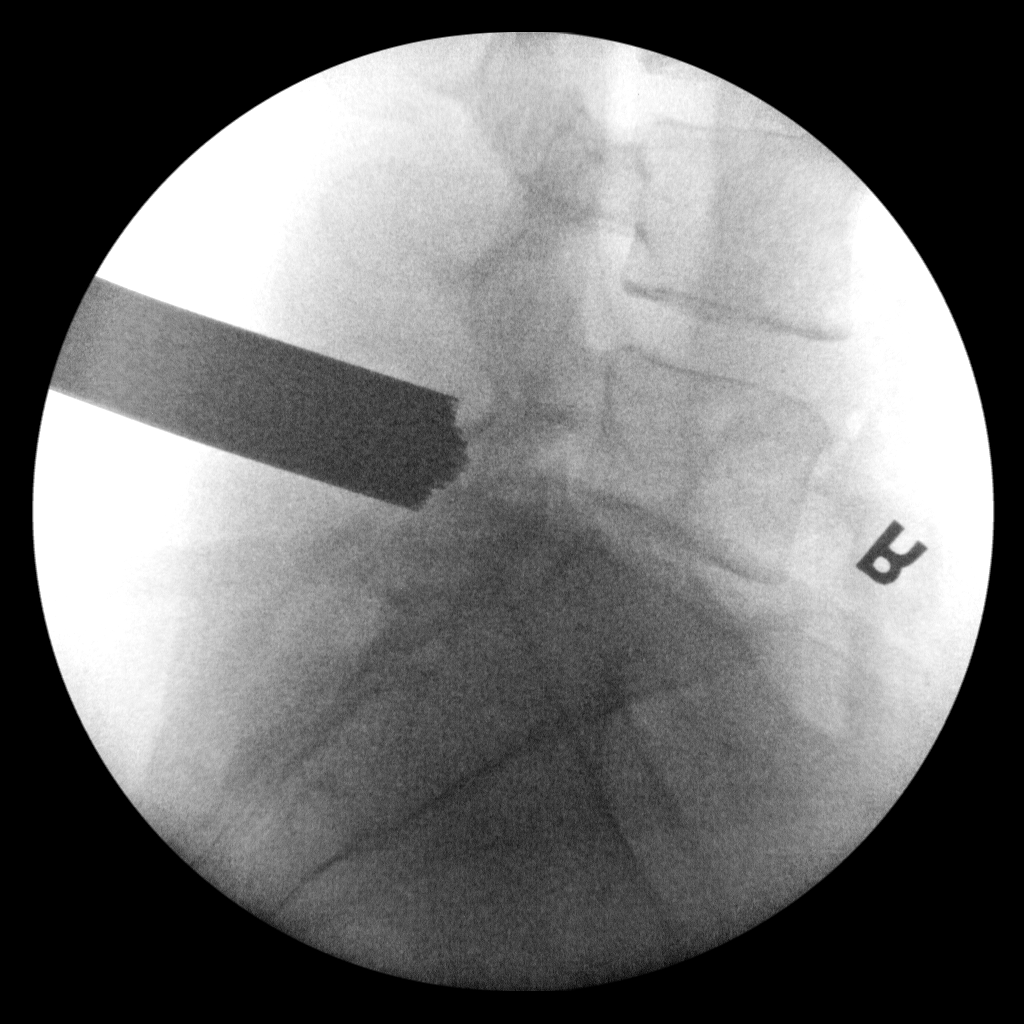

[1 of 1 positions shown; findings below may reference images not displayed]

FINDINGS: Surgical instruments in the posterior lumbar spine at the lowest
disc space level. This is considered L5-S1. Instrument posterior to
the spinal canal at L5-S1.
IMPRESSION: L5-S1 localized in the operating room

## 2022-05-08 ENCOUNTER — Encounter (HOSPITAL_BASED_OUTPATIENT_CLINIC_OR_DEPARTMENT_OTHER): Payer: Commercial Managed Care - HMO | Admitting: Nurse Practitioner

## 2022-06-19 ENCOUNTER — Ambulatory Visit (INDEPENDENT_AMBULATORY_CARE_PROVIDER_SITE_OTHER): Payer: Commercial Managed Care - HMO | Admitting: Nurse Practitioner

## 2022-06-19 ENCOUNTER — Encounter (HOSPITAL_BASED_OUTPATIENT_CLINIC_OR_DEPARTMENT_OTHER): Payer: Self-pay | Admitting: Nurse Practitioner

## 2022-06-19 VITALS — BP 122/84 | HR 74 | Ht 67.0 in | Wt 220.0 lb

## 2022-06-19 DIAGNOSIS — Z0000 Encounter for general adult medical examination without abnormal findings: Secondary | ICD-10-CM | POA: Diagnosis not present

## 2022-06-19 NOTE — Assessment & Plan Note (Signed)
Normal exam with no concerning findings. Labs today for routine evaluation. Recommend continue weight management efforts and healthy lifestyle options. Discussion of fertility and giving natural efforts several more months. We can send to fertility specialist if she wishes, but she would like to wait until they have tried for about a year before proceeding that route.

## 2022-06-19 NOTE — Patient Instructions (Addendum)
It was a pleasure seeing you today. I hope your time spent with [place] was pleasant and helpful. Please let us know if there is anything we can do to improve the service you receive.   Please let me know if you would like to see the fertility specialist in the future. I will check with Dr. Hyacinth Meeker to see if she can fill me in on what the process is like- If I get any information I will pass this along.   Keep working on your exercise! I am so proud of you.    Important Office Information Lab Results If labs were ordered, please note that you will see results through MyChart as soon as they come available from [facility].  It takes up to 5 business days for the results to be routed to me and for me to review them once all of the lab results have come through from [facility]. I will make recommendations based on your results and send these through MyChart or someone from the office will call you to discuss. If your labs are abnormal, we may contact you to schedule a visit to discuss the results and make recommendations.  If you have not heard from [place] within 5 business days or you have waited longer than a week and your lab results have not come through on MyChart, please feel free to call the office or send a message through MyChart to follow-up on these labs.   Referrals If referrals were placed today, the office where the referral was sent will contact you either by phone or through MyChart to set up scheduling. Please note that it can take up to a week for the referral office to contact you. If you do not hear from them in a week, please contact the referral office directly to inquire about scheduling.   Condition Treated If your condition worsens or you begin to have new symptoms, please schedule a follow-up appointment for further evaluation. If you are not sure if an appointment is needed, you may call the office to leave a message for the nurse and someone will contact you with recommendations.  If you  have an urgent or life threatening emergency, please do not call the office, but seek emergency evaluation by calling 911 or going to the nearest emergency room for evaluation.   MyChart and Phone Calls Please do not use MyChart for urgent messages. It may take up to 3 business days for MyChart messages to be read by staff and if they are unable to handle the request, an additional 3 business days for them to be routed to me and for my response.  Messages sent to the provider through MyChart do not come directly to the provider, please allow time for these messages to be routed and for me to respond.  We get a large volume of MyChart messages daily and these are responded to in the order received.   For urgent messages, please call the office at [phone] and speak with the front office staff or leave a message on the line of my assistant for guidance.  We are seeing patients from the hours of 8:00 am through 5:00 pm and calls directly to the nurse may not be answered immediately due to seeing patients, but your call will be returned as soon as possible.  Phone  messages received after 4:00 PM Monday through Thursday may not be returned until the following business day. Phone messages received after 11:00 AM on Friday may  not be returned until Monday.   After Hours We share on call hours with providers from other offices. If you have an urgent need after hours that cannot wait until the next business day, please contact the on call provider by calling the office number. A nurse will speak with you and contact the provider if needed for recommendations.  If you have an urgent or life threatening emergency after hours, please do not call the on call provider, but seek emergency evaluation by calling 911 or going to the nearest emergency room for evaluation.   Paperwork All paperwork requires a minimum of 5 days to complete and return to you or the designated personnel. Please keep this in mind when  bringing in forms or sending requests for paperwork completion to the office.

## 2022-06-19 NOTE — Progress Notes (Signed)
BP 122/84   Pulse 74   Ht _0  (1.702 m)   Wt 220 lb (99.8 kg)   LMP 05/25/2022 (Exact Date)   SpO2 99%   BMI 34.46 kg/m    Subjective:    Patient ID: Jenna Leonard, female    DOB: [birth year], 36 y.o.   MRN: [id number]  HPI: Jenna Leonard is a 36 y.o. female presenting on 06/19/2022 for comprehensive medical examination.  Last pap: 2021 with GYN- records needed  Current medical concerns include: Fertility- she and her husband are trying to conceive. She stopped birth control in January. She has regular periods, every 26 days. She is tracking ovulation. She would like to conceive within the next year if possible. She is interested in knowing when they should consider fertility testing if they are unable to get pregnant. She is open to the idea of adoption, but would like to try for a biological child.   Weight management- she has been working diligently on weight loss since approved to exercise by her back surgeon. She has been using weight watchers for a month and has lost over 6 lbs. She is working out routinely and reports that she feels good about her success so far.   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 )  She is currently sexually active with one partner She denies concerns today about STI  She denies concerns about skin changes today  She denies concerns about bowel changes today  She denies concerns about bladder changes today   Most Recent Depression Screen:     06/19/2022    7:24 PM 02/14/2021    4:05 PM  Depression screen PHQ 2/9  Decreased Interest 0 0  Down, Depressed, Hopeless 0 0  PHQ - 2 Score  0 0   Most Recent Anxiety Screen:     02/14/2021    4:06 PM  GAD 7 : Generalized Anxiety Score  Nervous, Anxious, on Edge 1  Control/stop worrying 0  Worry too much - different things 1  Trouble relaxing 0  Restless 0  Easily annoyed or irritable 0  Afraid - awful might happen 0  Total GAD 7 Score 2  Anxiety Difficulty Not difficult at all   Most Recent Fall Screen:    06/19/2022    3:37 PM 02/14/2021    4:05 PM  [place] 0  Number falls in past yr: 0 0  Injury with Fall? 0   Risk for fall due to : No Fall Risks No Fall Risks  Follow up Falls evaluation completed;Education provided     All ROS negative except what is listed above and in the HPI.   Past medical history, surgical history, medications, allergies, family history and social history reviewed with patient today and changes made to appropriate areas of the chart.  Past Medical History:  Past Medical History:  Diagnosis Date   Anxiety    Anxiety state 02/07/2013   Formatting of this note might be different from the original. IMO update 2017  Last Assessment & Plan:  Formatting of  this note might be different from the original. Patient reassured.  Discussed possible medication options.  She is not interested in medication at this time.  She is going to continue with counseling.  She will focus on yoga and breathing.  She will also do some meditation.   Bartholin's gland abscess 05/18/2013   Cramp in lower leg 02/14/2021   Cyst of Bartholin's gland duct 02/14/2021   Dysuria 11/04/2013   Gardnerella vaginitis 02/14/2021   GERD (gastroesophageal reflux disease)    Leg pain 10/19/2013   Last Assessment & Plan:  Formatting of this note might be different from the original. Will send for u/s to r/o DVT.  Pt reassured that DVT is unlikely.   Other chest pain 02/07/2013   Formatting of this note might be different from the original. 02/07/13 - Sx of chest pain and arm tightness. Low cardiac risk  and EKG normal  today. No risk factors for pulmonary disease/PE. Likely related to increased  stressors and anxiety. Pt reassured regarding cardiac findings of normal EKG.  Rx provided for Lexapro for anxiety. Benzo offered but declined for acute  attacks. Continue counseling   Pain in joint, pelvic region and thigh 11/11/2011   Last Assessment & Plan:  Formatting of this note might be different from the original. Achiness is somewhat vague, at first I thought due to anxiety and psychosomatic complaints, however on exam she does have some mild weakness.  Neurology consultation reasonable.  Referral done.   Panic attack 02/14/2021   Pityriasis versicolor 11/12/2012   Formatting of this note might be different from the original. 11/12/12 - Tx options reviewed. Given extensive surface area, will re-treat  wiht oral therapy. I/SE reviewed. Avoid alcohol while taking so as to protect  the liver. Also advised topical Selsun-Blue weekly as a body wash.   Pyelonephritis 11/04/2013   Last Assessment & Plan:  Formatting of this note might be different from the original. Symptoms, UA and exam very consistent with pyelonephritis; see below; will send off culture and labs per orders; currently stable so can treat as outpatient but per below discussed symptoms for which to go to the ER and counseled this could be a serious infection   Medications:  Current Outpatient Medications on File Prior to Visit  Medication Sig   famotidine (PEPCID) 20 MG tablet Take 20 mg by mouth 2 (two) times daily as needed for heartburn or indigestion.   Azelastine HCl 137 MCG/SPRAY SOLN Place 2 sprays into both nostrils 2 (two) times daily.   No current facility-administered medications on file prior to visit.   Surgical History:  Past Surgical History:  Procedure Laterality Date   LUMBAR LAMINECTOMY/ DECOMPRESSION WITH MET-RX Right 09/12/2021   Procedure: Right Lumbar Five- Sacral One Minimally invasive microdiscectomy;  Surgeon:  Karsten Ro, DO;  Location: [facility];  Service: Neurosurgery;  Laterality: Right;  3C   TONSILECTOMY/ADENOIDECTOMY WITH MYRINGOTOMY Bilateral    TONSILLECTOMY     Allergies:  Allergies  Allergen Reactions   Metronidazole Shortness Of Breath and Other (See Comments)    Patient denies allergy (unsure why its noted) Other reaction(s): Respiratory Distress (ALLERGY/intolerance)   Gabapentin Swelling, Other (See Comments) and Rash    Facial/tongue swelling Facial/tongue swelling   Sulfa Antibiotics Rash, Other (See Comments) and Hives   Social History:  Social History   Socioeconomic History   Marital status: Married    Spouse name: Shela Commons   Number of children: Not on file   Years of  education: Not on file   Highest education level: Not on file  Occupational History   Occupation: Glass blower/designer    Comment: furniture delivery  Tobacco Use   Smoking status: Former    Packs/day: 0.25    Years: 5.00    Total pack years: 1.25    Types: Cigarettes    Quit date: 2018    Years since quitting: 5.6   Smokeless tobacco: Never  Vaping Use   Vaping Use: Never used  Substance and Sexual Activity   Alcohol use: Yes    Alcohol/week: 4.0 - 6.0 standard drinks of alcohol    Types: 4 - 6 Glasses of wine per week   Drug use: Yes    Types: Marijuana    Comment: "often" user - Last use 08/20/21   Sexual activity: Yes    Birth control/protection: Pill  Other Topics Concern   Not on file  Social History Narrative   Not on file   Social Determinants of Health   Financial Resource Strain: Not on file  Food Insecurity: Not on file  Transportation Needs: No Transportation Needs (02/14/2021)   PRAPARE - Hydrologist (Medical): No    Lack of Transportation (Non-Medical): No  Physical Activity: Sufficiently Active (02/14/2021)   Exercise Vital Sign    Days of Exercise per Week: 5 days    Minutes of Exercise per Session: 30 min  Stress: Not on file  Social  Connections: Not on file  Intimate Partner Violence: Not At Risk (02/14/2021)   Humiliation, Afraid, Rape, and Kick questionnaire    Fear of Current or Ex-Partner: No    Emotionally Abused: No    Physically Abused: No    Sexually Abused: No   Social History   Tobacco Use  Smoking Status Former   Packs/day: 0.25   Years: 5.00   Total pack years: 1.25   Types: Cigarettes   Quit date: 2018   Years since quitting: 5.6  Smokeless Tobacco Never   Social History   Substance and Sexual Activity  Alcohol Use Yes   Alcohol/week: 4.0 - 6.0 standard drinks of alcohol   Types: 4 - 6 Glasses of wine per week   Family History:  Family History  Problem Relation Age of Onset   Diabetes Mother    Hypertension Mother    Kidney disease Mother    Obesity Mother    Breast cancer Mother    Lung cancer Father    Alzheimer's disease Maternal Grandmother        Objective:    BP 122/84   Pulse 74   Ht _0  (1.702 m)   Wt 220 lb (99.8 kg)   LMP 05/25/2022 (Exact Date)   SpO2 99%   BMI 34.46 kg/m   Wt Readings from Last 3 Encounters:  06/19/22 220 lb (99.8 kg)  09/12/21 215 lb (97.5 kg)  02/14/21 213 lb 12.8 oz (97 kg)    Physical Exam Vitals and nursing note reviewed.  Constitutional:      General: She is not in acute distress.    Appearance: Normal appearance.  HENT:     Head: Normocephalic and atraumatic.     Right Ear: Hearing, tympanic membrane, ear canal and external ear normal.     Left Ear: Hearing, tympanic membrane, ear canal and external ear normal.     Nose: Nose normal.     Right Sinus: No maxillary sinus tenderness or frontal sinus tenderness.  Left Sinus: No maxillary sinus tenderness or frontal sinus tenderness.     Mouth/Throat:     Lips: Pink.     Mouth: Mucous membranes are moist.     Pharynx: Oropharynx is clear.  Eyes:     General: Lids are normal. Vision grossly intact.     Extraocular Movements: Extraocular movements intact.      Conjunctiva/sclera: Conjunctivae normal.     Pupils: Pupils are equal, round, and reactive to light.     Funduscopic exam:    Right eye: Red reflex present.        Left eye: Red reflex present.    Visual Fields: Right eye visual fields normal and left eye visual fields normal.  Neck:     Thyroid: No thyromegaly.     Vascular: No carotid bruit.  Cardiovascular:     Rate and Rhythm: Normal rate and regular rhythm.     Chest Wall: PMI is not displaced.     Pulses: Normal pulses.          Dorsalis pedis pulses are 2+ on the right side and 2+ on the left side.       Posterior tibial pulses are 2+ on the right side and 2+ on the left side.     Heart sounds: Normal heart sounds. No murmur heard. Pulmonary:     Effort: Pulmonary effort is normal. No respiratory distress.     Breath sounds: Normal breath sounds.  Chest:     Chest wall: No swelling or tenderness.  Breasts:    Breasts are symmetrical.     Right: Normal.     Left: Normal.  Abdominal:     General: Abdomen is flat. Bowel sounds are normal. There is no distension.     Palpations: Abdomen is soft. There is no hepatomegaly, splenomegaly or mass.     Tenderness: There is no abdominal tenderness. There is no right CVA tenderness, left CVA tenderness, guarding or rebound.  Musculoskeletal:        General: Normal range of motion.     Cervical back: Full passive range of motion without pain, normal range of motion and neck supple. No tenderness.     Right lower leg: No edema.     Left lower leg: No edema.  Feet:     Left foot:     Toenail Condition: Left toenails are normal.  Lymphadenopathy:     Cervical: No cervical adenopathy.     Upper Body:     Right upper body: No supraclavicular adenopathy.     Left upper body: No supraclavicular adenopathy.  Skin:    General: Skin is warm and dry.     Capillary Refill: Capillary refill takes less than 2 seconds.     Nails: There is no clubbing.  Neurological:     General: No focal  deficit present.     Mental Status: She is alert and oriented to person, place, and time.     GCS: GCS eye subscore is 4. GCS verbal subscore is 5. GCS motor subscore is 6.     Sensory: Sensation is intact.     Motor: Motor function is intact.     Coordination: Coordination is intact.     Gait: Gait is intact.     Deep Tendon Reflexes: Reflexes are normal and symmetric.  Psychiatric:        Attention and Perception: Attention normal.        Mood and Affect: Mood normal.  Speech: Speech normal.        Behavior: Behavior normal. Behavior is cooperative.        Thought Content: Thought content normal.        Cognition and Memory: Cognition and memory normal.        Judgment: Judgment normal.     Results for orders placed or performed during the hospital encounter of 09/12/21  SARS Coronavirus 2 by RT PCR (hospital order, performed in [facility] hospital lab) Nasopharyngeal Nasopharyngeal Swab   Specimen: Nasopharyngeal Swab  Result Value Ref Range   SARS Coronavirus 2 NEGATIVE NEGATIVE  Surgical pcr screen   Specimen: Nasal Mucosa; Nasal Swab  Result Value Ref Range   MRSA, PCR NEGATIVE NEGATIVE   Staphylococcus aureus NEGATIVE NEGATIVE  CBC per protocol  Result Value Ref Range   WBC 5.3 4.0 - 10.5 K/uL   RBC 4.36 3.87 - 5.11 MIL/uL   Hemoglobin 13.4 12.0 - 15.0 g/dL   HCT 40.6 36.0 - 46.0 %   MCV 93.1 80.0 - 100.0 fL   MCH 30.7 26.0 - 34.0 pg   MCHC 33.0 30.0 - 36.0 g/dL   RDW 12.0 11.5 - 15.5 %   Platelets 208 150 - 400 K/uL   nRBC 0.0 0.0 - 0.2 %  Pregnancy, urine POC  Result Value Ref Range   Preg Test, Ur NEGATIVE NEGATIVE      Assessment & Plan:   Problem List Items Addressed This Visit     Encounter for annual physical exam - Primary    Normal exam with no concerning findings. Labs today for routine evaluation. Recommend continue weight management efforts and healthy lifestyle options. Discussion of fertility and giving natural efforts several more  months. We can send to fertility specialist if she wishes, but she would like to wait until they have tried for about a year before proceeding that route.       Other Visit Diagnoses     Healthcare maintenance       Relevant Orders   CBC With Diff/Platelet   Hemoglobin A1c   Comprehensive metabolic panel   VITAMIN D 25 Hydroxy (Vit-D Deficiency, Fractures)   Thyroid Panel With TSH         IMMUNIZATIONS:   - Tdap: Tetanus vaccination status reviewed: tetanus status unknown to the patient. - Influenza: Postponed to flu season - Pneumovax: Not applicable - Prevnar: Not applicable - HPV: Not applicable - Zostavax vaccine: Not applicable  SCREENING: - Pap smear:  completed in 2021- records needed - STI testing: deferred -Mammogram: Not applicable  - Colonoscopy: Not applicable  - Bone Density: Not applicable  -Hearing Test: Not applicable  -Spirometry: Not applicable   Follow up plan: Return in about 1 year (around 06/20/2023) for CPE.  NEXT PREVENTATIVE PHYSICAL DUE IN 1 YEAR.  PATIENT COUNSELING PROVIDED:   For all adult patients, I recommend   A well balanced diet low in saturated fats, cholesterol, and moderation in carbohydrates.   This can be as simple as monitoring portion sizes and cutting back on sugary beverages such as soda and juice to start with.    Daily water consumption of at least 64 ounces.  Physical activity at least 180 minutes per week, if just starting out.   This can be as simple as taking the stairs instead of the elevator and walking 2-3 laps around the office  purposefully every day.   STD protection, partner selection, and regular testing if high risk.  Limited consumption of  alcoholic beverages if alcohol is consumed.  For women, I recommend no more than 7 alcoholic beverages per week, spread out throughout the week.  Avoid "binge" drinking or consuming large quantities of alcohol in one setting.   Please let me know if you feel you may  need help with reduction or quitting alcohol consumption.   Avoidance of nicotine, if used.  Please let me know if you feel you may need help with reduction or quitting nicotine use.   Daily mental health attention.  This can be in the form of 5 minute daily meditation, prayer, journaling, yoga, reflection, etc.   Purposeful attention to your emotions and mental state can significantly improve your overall wellbeing and Health.  Please know that I am here to help you with all of your health care goals and am happy to work with you to find a solution that works best for you.  The greatest advice I have received with any changes in life are to take it one step at a time, that even means if all you can focus on is the next 60 seconds, then do that and celebrate your victories.  With any changes in life, you will have set backs, and that is OK. The important thing to remember is, if you have a set back, it is not a failure, it is an opportunity to try again!  Health Maintenance Recommendations Screening Testing Mammogram Every 1 -2 years based on history and risk factors Starting at age 12 Pap Smear Ages 21-39 every 3 years Ages 87-65 every 5 years with HPV testing More frequent testing may be required based on results and history Colon Cancer Screening Every 1-10 years based on test performed, risk factors, and history Starting at age 71 Bone Density Screening Every 2-10 years based on history Starting at age 69 for women Recommendations for men differ based on medication usage, history, and risk factors AAA Screening One time ultrasound Men 12-75 years old who have every smoked Lung Cancer Screening Low Dose Lung CT every 12 months Age 16-80 years with a 30 pack-year smoking history who still smoke or who have quit within the last 15 years  Screening Labs Routine  Labs: Complete Blood Count (CBC), Complete Metabolic Panel (CMP), Cholesterol (Lipid Panel) Every 6-12 months based on  history and medications May be recommended more frequently based on current conditions or previous results Hemoglobin A1c Lab Every 3-12 months based on history and previous results Starting at age 52 or earlier with diagnosis of diabetes, high cholesterol, BMI >26, and/or risk factors Frequent monitoring for patients with diabetes to ensure blood sugar control Thyroid Panel (TSH w/ T3 & T4) Every 6 months based on history, symptoms, and risk factors May be repeated more often if on medication HIV One time testing for all patients 18 and older May be repeated more frequently for patients with increased risk factors or exposure Hepatitis C One time testing for all patients 68 and older May be repeated more frequently for patients with increased risk factors or exposure Gonorrhea, Chlamydia Every 12 months for all sexually active persons 13-24 years Additional monitoring may be recommended for those who are considered high risk or who have symptoms PSA Men 49-81 years old with risk factors Additional screening may be recommended from age 41-69 based on risk factors, symptoms, and history  Vaccine Recommendations Tetanus Booster All adults every 10 years Flu Vaccine All patients 6 months and older every year COVID Vaccine All patients 12 years  and older Initial dosing with booster May recommend additional booster based on age and health history HPV Vaccine 2 doses all patients age 15-26 Dosing may be considered for patients over 26 Shingles Vaccine (Shingrix) 2 doses all adults 57 years and older Pneumonia (Pneumovax 23) All adults 90 years and older May recommend earlier dosing based on health history Pneumonia (Prevnar 13) All adults 86 years and older Dosed 1 year after Pneumovax 23  Additional Screening, Testing, and Vaccinations may be recommended on an individualized basis based on family history, health history, risk factors, and/or exposure.

## 2022-06-20 LAB — CBC WITH DIFF/PLATELET
Basophils Absolute: 0 10*3/uL (ref 0.0–0.2)
Basos: 1 %
EOS (ABSOLUTE): 0.1 10*3/uL (ref 0.0–0.4)
Eos: 2 %
Hematocrit: 41.4 % (ref 34.0–46.6)
Hemoglobin: 13.9 g/dL (ref 11.1–15.9)
Immature Grans (Abs): 0 10*3/uL (ref 0.0–0.1)
Immature Granulocytes: 0 %
Lymphocytes Absolute: 2.9 10*3/uL (ref 0.7–3.1)
Lymphs: 41 %
MCH: 30.5 pg (ref 26.6–33.0)
MCHC: 33.6 g/dL (ref 31.5–35.7)
MCV: 91 fL (ref 79–97)
Monocytes Absolute: 0.7 10*3/uL (ref 0.1–0.9)
Monocytes: 10 %
Neutrophils Absolute: 3.2 10*3/uL (ref 1.4–7.0)
Neutrophils: 46 %
Platelets: 208 10*3/uL (ref 150–450)
RBC: 4.55 x10E6/uL (ref 3.77–5.28)
RDW: 12.5 % (ref 11.7–15.4)
WBC: 7 10*3/uL (ref 3.4–10.8)

## 2022-06-20 LAB — VITAMIN D 25 HYDROXY (VIT D DEFICIENCY, FRACTURES): Vit D, 25-Hydroxy: 23.3 ng/mL — ABNORMAL LOW (ref 30.0–100.0)

## 2022-06-20 LAB — COMPREHENSIVE METABOLIC PANEL
ALT: 28 IU/L (ref 0–32)
AST: 24 IU/L (ref 0–40)
Albumin/Globulin Ratio: 1.5 (ref 1.2–2.2)
Albumin: 4.5 g/dL (ref 3.9–4.9)
Alkaline Phosphatase: 82 IU/L (ref 44–121)
BUN/Creatinine Ratio: 14 (ref 9–23)
BUN: 11 mg/dL (ref 6–20)
Bilirubin Total: 0.2 mg/dL (ref 0.0–1.2)
CO2: 21 mmol/L (ref 20–29)
Calcium: 9.6 mg/dL (ref 8.7–10.2)
Chloride: 102 mmol/L (ref 96–106)
Creatinine, Ser: 0.76 mg/dL (ref 0.57–1.00)
Globulin, Total: 3.1 g/dL (ref 1.5–4.5)
Glucose: 89 mg/dL (ref 70–99)
Potassium: 4.3 mmol/L (ref 3.5–5.2)
Sodium: 139 mmol/L (ref 134–144)
Total Protein: 7.6 g/dL (ref 6.0–8.5)
eGFR: 106 mL/min/{1.73_m2} (ref >59)

## 2022-06-20 LAB — THYROID PANEL WITH TSH
Free Thyroxine Index: 2 (ref 1.2–4.9)
T3 Uptake Ratio: 28 % (ref 24–39)
T4, Total: 7.2 ug/dL (ref 4.5–12.0)
TSH: 1.72 u[IU]/mL (ref 0.450–4.500)

## 2022-06-20 LAB — HEMOGLOBIN A1C
Est. average glucose Bld gHb Est-mCnc: 100 mg/dL
Hgb A1c MFr Bld: 5.1 % (ref 4.8–5.6)

## 2022-06-27 ENCOUNTER — Encounter (HOSPITAL_BASED_OUTPATIENT_CLINIC_OR_DEPARTMENT_OTHER): Payer: Self-pay | Admitting: Nurse Practitioner

## 2022-07-02 NOTE — Telephone Encounter (Signed)
Reviewed patient chart. Lipid lab last completed 02/15/21 per provider documentation.

## 2022-07-17 ENCOUNTER — Encounter (HOSPITAL_BASED_OUTPATIENT_CLINIC_OR_DEPARTMENT_OTHER): Payer: Self-pay | Admitting: Nurse Practitioner

## 2022-07-17 ENCOUNTER — Other Ambulatory Visit (HOSPITAL_BASED_OUTPATIENT_CLINIC_OR_DEPARTMENT_OTHER): Payer: Self-pay

## 2022-07-17 DIAGNOSIS — B379 Candidiasis, unspecified: Secondary | ICD-10-CM

## 2022-07-17 MED ORDER — FLUCONAZOLE 150 MG PO TABS: 150.0000 mg | ORAL_TABLET | Freq: Once | ORAL | 0 refills | Status: AC

## 2023-01-28 ENCOUNTER — Encounter: Payer: Commercial Managed Care - HMO | Admitting: Radiology

## 2023-02-04 ENCOUNTER — Ambulatory Visit (INDEPENDENT_AMBULATORY_CARE_PROVIDER_SITE_OTHER): Payer: Commercial Managed Care - HMO | Admitting: Family

## 2023-02-04 ENCOUNTER — Encounter: Payer: Self-pay | Admitting: Family

## 2023-02-04 VITALS — BP 113/78 | HR 75 | Temp 97.8°F | Ht 67.0 in | Wt 231.0 lb

## 2023-02-04 DIAGNOSIS — R202 Paresthesia of skin: Secondary | ICD-10-CM | POA: Diagnosis not present

## 2023-02-04 DIAGNOSIS — R200 Anesthesia of skin: Secondary | ICD-10-CM | POA: Diagnosis not present

## 2023-02-04 NOTE — Progress Notes (Signed)
Patient ID: Jenna Leonard, female    DOB: Oct 24, 1986, 37 y.o.   MRN: [id number]  Chief Complaint  Patient presents with   Tingling    Pt having tingling in left hand and arm, pt states started bout a month ago, started in hand, is now in chest and back of neck     HPI:      Hand tingling:  pt is right handed, tingling started in her hand and slowly has progressed up her arm into her shoulder and left side of neck, numbness in first 4 fingers in tips. Reports hx of left shoulder fx, no surgery needed. Sx were intermittent and now are constant.   Assessment & Plan:  1. Numbness and tingling of left upper extremity - sending referral to sports med. negative phalen's test; possible cervical nerve involvement. Advised can try Robaxin tabs she has left over from past back surgeries and see if any relief. Also generic Aleve, 2 tabs bid for 1 week.  Can also try a wrist brace with hard plate on ventral side, wear for 2 weeks while sleeping and during day if able. Referring to sports med.   - Ambulatory referral to Sports Medicine  Subjective:    Outpatient Medications Prior to Visit  Medication Sig Dispense Refill   Azelastine HCl 137 MCG/SPRAY SOLN Place 2 sprays into both nostrils 2 (two) times daily. (Patient not taking: Reported on 02/04/2023)     famotidine (PEPCID) 20 MG tablet Take 20 mg by mouth 2 (two) times daily as needed for heartburn or indigestion. (Patient not taking: Reported on 02/04/2023)     No facility-administered medications prior to visit.   Past Medical History:  Diagnosis Date   Anxiety    Anxiety state 02/07/2013   Formatting of this note might be different from the original. IMO update 2017  Last Assessment & Plan:  Formatting of this note might be different from the original. Patient reassured.  Discussed possible medication options.  She is not interested in medication at this time.  She is going to continue with counseling.  She will focus on yoga and breathing.  She  will also do some meditation.   Bartholin's gland abscess 05/18/2013   Cramp in lower leg 02/14/2021   Cyst of Bartholin's gland duct 02/14/2021   Dysuria 11/04/2013   Gardnerella vaginitis 02/14/2021   GERD (gastroesophageal reflux disease)    Leg pain 10/19/2013   Last Assessment & Plan:  Formatting of this note might be different from the original. Will send for u/s to r/o DVT.  Pt reassured that DVT is unlikely.   Other chest pain 02/07/2013   Formatting of this note might be different from the original. 02/07/13 - Sx of chest pain and arm tightness. Low cardiac risk and EKG normal  today. No risk factors for pulmonary disease/PE. Likely related to increased  stressors and anxiety. Pt reassured regarding cardiac findings of normal EKG.  Rx provided for Lexapro for anxiety. Benzo offered but declined for acute  attacks. Continue counseling   Pain in joint, pelvic region and thigh 11/11/2011   Last Assessment & Plan:  Formatting of this note might be different from the original. Achiness is somewhat vague, at first I thought due to anxiety and psychosomatic complaints, however on exam she does have some mild weakness.  Neurology consultation reasonable.  Referral done.   Panic attack 02/14/2021   Pityriasis versicolor 11/12/2012   Formatting of this note might be different from the original.  11/12/12 - Tx options reviewed. Given extensive surface area, will re-treat  wiht oral therapy. I/SE reviewed. Avoid alcohol while taking so as to protect  the liver. Also advised topical Selsun-Blue weekly as a body wash.   Pyelonephritis 11/04/2013   Last Assessment & Plan:  Formatting of this note might be different from the original. Symptoms, UA and exam very consistent with pyelonephritis; see below; will send off culture and labs per orders; currently stable so can treat as outpatient but per below discussed symptoms for which to go to the ER and counseled this could be a serious infection   Past  Surgical History:  Procedure Laterality Date   LUMBAR LAMINECTOMY/ DECOMPRESSION WITH MET-RX Right 09/12/2021   Procedure: Right Lumbar Five- Sacral One Minimally invasive microdiscectomy;  Surgeon: Karsten Ro, DO;  Location: [facility];  Service: Neurosurgery;  Laterality: Right;  3C   TONSILECTOMY/ADENOIDECTOMY WITH MYRINGOTOMY Bilateral    TONSILLECTOMY     Allergies  Allergen Reactions   Metronidazole Shortness Of Breath and Other (See Comments)    Patient denies allergy (unsure why its noted) Other reaction(s): Respiratory Distress (ALLERGY/intolerance)   Gabapentin Swelling, Other (See Comments) and Rash    Facial/tongue swelling Facial/tongue swelling   Sulfa Antibiotics Rash, Other (See Comments) and Hives      Objective:    Physical Exam Vitals and nursing note reviewed.  Constitutional:      Appearance: Normal appearance.  Cardiovascular:     Rate and Rhythm: Normal rate and regular rhythm.  Pulmonary:     Effort: Pulmonary effort is normal.     Breath sounds: Normal breath sounds.  Musculoskeletal:        General: Normal range of motion.  Skin:    General: Skin is warm and dry.  Neurological:     Mental Status: She is alert.     Sensory: Sensation is intact.     Motor: No weakness (negative phalens test).     Coordination: Coordination is intact.  Psychiatric:        Mood and Affect: Mood normal.        Behavior: Behavior normal.    BP 113/78 (BP Location: Right Arm, Patient Position: Sitting)   Pulse 75   Temp 97.8 F (36.6 C) (Temporal)   Ht 5\' 7"  (1.702 m)   Wt 231 lb (104.8 kg)   LMP 01/15/2023 (Exact Date)   SpO2 98%   BMI 36.18 kg/m  Wt Readings from Last 3 Encounters:  02/04/23 231 lb (104.8 kg)  06/19/22 220 lb (99.8 kg)  09/12/21 215 lb (97.5 kg)     Jeanie Sewer, NP

## 2023-02-04 NOTE — Patient Instructions (Signed)
Welcome to [facility], It was a pleasure meeting you today!    As discussed, I have sent a referral to our sports medicine office. You can try the Methocarbamol you have at home and see if any difference in your symptoms, also you can try up to 600mg  of Ibuprofen 3 times per day or 2 generic Aleve twice a day, with the Methocarbamol or in between. Look for a wrist brace with a hard plate on the underside of wrist and wear this nightly and during the day if able for 2 weeks to see if symptoms improve.  Let us know of any future concerns!    PLEASE NOTE: If you had any LAB tests please let us know if you have not heard back within a few days. You may see your results on MyChart before we have a chance to review them but we will give you a call once they are reviewed by [place]. If we ordered any REFERRALS today, please let us know if you have not heard from their office within the next week.  Let us know through MyChart if you are needing REFILLS, or have your pharmacy send [place] the request. You can also use MyChart to communicate with me or any office staff.  Please try these tips to maintain a healthy lifestyle: It is important that you exercise regularly at least 30 minutes 5 times a week. Think about what you will eat, plan ahead. Choose whole foods, & think  "clean, green, fresh or frozen" over canned, processed or packaged foods which are more sugary, salty, and fatty. 70 to 75% of food eaten should be fresh vegetables and protein. 2-3  meals daily with healthy snacks between meals, but must be whole fruit, protein or vegetables. Aim to eat over a 10 hour period when you are active, for example, 7am to 5pm, and then STOP after your last meal of the day, drinking only water.  Shorter eating windows, 6-8 hours, are showing benefits in heart disease and blood sugar regulation. Drink water every day! Shoot for 64 ounces daily = 8 cups, no other drink is as healthy!  Fruit juice is best enjoyed in a healthy way, by EATING the fruit.

## 2023-02-09 ENCOUNTER — Ambulatory Visit: Payer: Commercial Managed Care - HMO | Admitting: Nurse Practitioner

## 2023-02-11 ENCOUNTER — Other Ambulatory Visit: Payer: Self-pay

## 2023-02-11 ENCOUNTER — Ambulatory Visit (INDEPENDENT_AMBULATORY_CARE_PROVIDER_SITE_OTHER): Payer: Commercial Managed Care - HMO | Admitting: Sports Medicine

## 2023-02-11 VITALS — BP 124/84 | Ht 67.0 in | Wt 230.0 lb

## 2023-02-11 DIAGNOSIS — R200 Anesthesia of skin: Secondary | ICD-10-CM

## 2023-02-11 NOTE — Progress Notes (Signed)
New Patient Office Visit  Subjective   Patient ID: Jenna Leonard, female    DOB: [birth year]  Age: 37 y.o. MRN: [id number]  Left hand and arm numbness and tingling  Jenna Leonard is here today with chief complaint of left hand and arm numbness and tingling.  She reports the symptoms began about 1 month ago and her hand.  She denies any pain but endorses a sensation of pins-and-needles in her lateral 4 fingers.  Sensation comes and goes and is not constant.  She has noticed some the numbness and tingling creeping up her arm on the back and up to her neck across her chest.  She denies any symptoms like this in the past.  She denies any trauma or injury to the area.  She does not have any weakness with her grip strength or decreased range of motion.   ROS as listed above in HPI    Objective:     BP 124/84   Ht 5\' 7"  (1.702 m)   Wt 230 lb (104.3 kg)   LMP 01/15/2023 (Exact Date)   BMI 36.02 kg/m   Physical Exam Vitals reviewed.  Constitutional:      General: She is not in acute distress.    Appearance: Normal appearance. She is obese. She is not ill-appearing or toxic-appearing.  Pulmonary:     Effort: Pulmonary effort is normal.  Skin:    General: Skin is warm.     Findings: No bruising, erythema or rash.  Neurological:     Mental Status: She is alert.   Left arm: No obvious deformity or asymmetry.  No ecchymosis edema or effusion.  She does have multiple tattoos on her left upper extremity.  She has some decreased sensation to light touch over the palmar aspect of her thumb index middle and ring finger, the posterior aspect of her forearm.  No tenderness to palpation at the wrist or elbow.  Full range of motion at the wrist.  Grip strength 5/5 bilaterally.  Radial pulse 2+ bilaterally.  Positive Tinel's at the wrist and elbow Neck: No obvious deformity or asymmetry.  She has good range of motion of flexion, extension and rotation.  Negative Spurling's test.  Limited ultrasound: Left  median nerve was visualized, bifid median nerve, measured to be 0.11 cm Right median nerve was visualized, bifid median nerve, measured to be 0.9 cm Impression: Borderline enlargement left median nerve.  Bilateral bifid median nerves    Assessment & Plan:   Problem List Items Addressed This Visit       Other   Left arm numbness - Primary    Numbness extending from the hand up the arm.  Patient has equivocal Tinel's at the wrist and elbow.  Ultrasound evaluation showed borderline enlargement of the left median nerve.  I suspect her symptoms may be coming from her neck as they are traveling down her arm.  She can continue to wear the cock up wrist splint at night and throughout work activities.  We will have some repeat cervical x-rays ordered today.  On previous imaging she had some loss of normal cervical lordosis in 2022.  I have also sent her with some home stretches and exercises to work on.  Will follow-up with her in 4 to 6 weeks.  She was counseled to call our office if her symptoms worsen prior to her visit.      Relevant Orders   [place] LIMITED JOINT SPACE STRUCTURES UP LEFT   DG Cervical Spine  2 or 3 views    Return in about 4 weeks (around 03/11/2023).    Jenna Leach, DO  Addendum:  I was the preceptor for this visit and available for immediate consultation.  Jenna Blizzard MD Jenna Leonard

## 2023-02-11 NOTE — Assessment & Plan Note (Signed)
Numbness extending from the hand up the arm.  Patient has equivocal Tinel's at the wrist and elbow.  Ultrasound evaluation showed borderline enlargement of the left median nerve.  I suspect her symptoms may be coming from her neck as they are traveling down her arm.  She can continue to wear the cock up wrist splint at night and throughout work activities.  We will have some repeat cervical x-rays ordered today.  On previous imaging she had some loss of normal cervical lordosis in 2022.  I have also sent her with some home stretches and exercises to work on.  Will follow-up with her in 4 to 6 weeks.  She was counseled to call our office if her symptoms worsen prior to her visit.

## 2023-02-16 ENCOUNTER — Ambulatory Visit: Payer: Commercial Managed Care - HMO | Admitting: Family
